# Patient Record
Sex: Male | Born: 1975
Health system: Southern US, Community
[De-identification: ages and names within clinical notes are randomized; demographics above are authoritative.]

## PROBLEM LIST (undated history)

## (undated) DIAGNOSIS — R7989 Other specified abnormal findings of blood chemistry: Secondary | ICD-10-CM

## (undated) DIAGNOSIS — E785 Hyperlipidemia, unspecified: Secondary | ICD-10-CM

## (undated) HISTORY — PX: VASECTOMY: SHX75

## (undated) HISTORY — DX: Hyperlipidemia, unspecified: E78.5

## (undated) HISTORY — DX: Other specified abnormal findings of blood chemistry: R79.89

---

## 2013-03-04 ENCOUNTER — Ambulatory Visit: Payer: Self-pay | Admitting: Physician Assistant

## 2013-03-04 ENCOUNTER — Encounter: Payer: Self-pay | Admitting: Physician Assistant

## 2013-03-04 ENCOUNTER — Ambulatory Visit (INDEPENDENT_AMBULATORY_CARE_PROVIDER_SITE_OTHER): Payer: BC Managed Care – PPO | Admitting: Physician Assistant

## 2013-03-04 VITALS — BP 153/89 | HR 58 | Temp 97.7°F | Ht 73.0 in | Wt 202.2 lb

## 2013-03-04 DIAGNOSIS — E291 Testicular hypofunction: Secondary | ICD-10-CM | POA: Insufficient documentation

## 2013-03-04 MED ORDER — TESTOSTERONE CYPIONATE 200 MG/ML IM SOLN: 200.0000 mg | INTRAMUSCULAR | Status: DC

## 2013-03-04 NOTE — Progress Notes (Signed)
  Subjective:    Patient ID: Jacob Morris, male    DOB: [birth year], 37 y.o.   MRN: [id number]  HPI Recheck of hypogonadism; would like to taper off testosterone injections with intention of discontinuing testosterone supplement; exercises regularly, runs 15 mi/wk, 300 push ups/wk, eats whole foods Current testosterone injection is 200mg /ml 0.70ml injection every 10 days   Review of Systems  All other systems reviewed and are negative.       Objective:   Physical Exam  Vitals reviewed. Constitutional: He is oriented to person, place, and time. He appears well-developed and well-nourished.  HENT:  Head: Normocephalic and atraumatic.  Right Ear: External ear normal.  Left Ear: External ear normal.  Mouth/Throat: Oropharynx is clear and moist.  Eyes: Conjunctivae and EOM are normal. Pupils are equal, round, and reactive to light.  Neck: Normal range of motion. Neck supple.  Cardiovascular: Normal rate, regular rhythm and normal heart sounds.   Pulmonary/Chest: Effort normal and breath sounds normal.  Abdominal: Soft. Bowel sounds are normal.  Musculoskeletal: Normal range of motion.  Neurological: He is alert and oriented to person, place, and time.  Skin: Skin is warm and dry.  Psychiatric: He has a normal mood and affect. His behavior is normal. Judgment and thought content normal.          Assessment & Plan:

## 2013-03-09 ENCOUNTER — Other Ambulatory Visit: Payer: Self-pay | Admitting: Physician Assistant

## 2013-03-09 NOTE — Progress Notes (Signed)
Called Jacob Morris with tesosterone result which was low at 133; discussed with him to continue the 0.66ml injection every 10 days as previously scheduled; we are backing up from our recent in office discussion of tapering off testosterone; Jacob Morris is to schedule a follow up to recheck tesosterone level in late June or July

## 2013-09-05 ENCOUNTER — Encounter: Payer: Self-pay | Admitting: Family Medicine

## 2013-09-05 ENCOUNTER — Ambulatory Visit (INDEPENDENT_AMBULATORY_CARE_PROVIDER_SITE_OTHER): Payer: BC Managed Care – PPO | Admitting: Family Medicine

## 2013-09-05 VITALS — BP 146/87 | HR 76 | Temp 98.7°F | Ht 73.5 in | Wt 209.0 lb

## 2013-09-05 DIAGNOSIS — Z23 Encounter for immunization: Secondary | ICD-10-CM

## 2013-09-05 DIAGNOSIS — R030 Elevated blood-pressure reading, without diagnosis of hypertension: Secondary | ICD-10-CM

## 2013-09-05 DIAGNOSIS — M25519 Pain in unspecified shoulder: Secondary | ICD-10-CM

## 2013-09-05 DIAGNOSIS — E291 Testicular hypofunction: Secondary | ICD-10-CM

## 2013-09-05 DIAGNOSIS — M25511 Pain in right shoulder: Secondary | ICD-10-CM

## 2013-09-05 DIAGNOSIS — N400 Enlarged prostate without lower urinary tract symptoms: Secondary | ICD-10-CM

## 2013-09-05 MED ORDER — "SYRINGE/NEEDLE (DISP) 21G X 1"" 3 ML MISC": Status: DC

## 2013-09-05 MED ORDER — TESTOSTERONE CYPIONATE 200 MG/ML IM SOLN: 100.0000 mg | Freq: Once | INTRAMUSCULAR | Status: DC

## 2013-09-05 MED ORDER — MELOXICAM 15 MG PO TABS: 15.0000 mg | ORAL_TABLET | Freq: Every day | ORAL | Status: DC

## 2013-09-05 NOTE — Progress Notes (Signed)
Subjective:    Patient ID: Jacob Morris, male    DOB: December 12, 1975, 37 y.o.   MRN: [id number]  HPI Pt here for follow up and management of chronic medical problems. Patient returns to clinic today specifically for followup and management of hypergonadism. He is currently taking injections every 10 days and getting a total of 600 mg of testosterone monthly. As of note family history is positive for hypertension in his mother. His health maintenance is up-to-date except for getting his flu shot and doing an FOBT. The patient says that he exercises regularly and that he actually does a half cc of testosterone which would be 100 mg every 10 days.   Patient Active Problem List   Diagnosis Date Noted  . Hypogonadism male 03/04/2013   Outpatient Encounter Prescriptions as of 09/05/2013  Medication Sig Dispense Refill  . testosterone cypionate (DEPOTESTOTERONE CYPIONATE) 200 MG/ML injection Inject 200 mg into the muscle every 14 (fourteen) days. Injection q 10 days (at home med)      . [DISCONTINUED] testosterone cypionate (DEPOTESTOTERONE CYPIONATE) 200 MG/ML injection Inject 1 mL (200 mg total) into the muscle every 14 (fourteen) days. Tapering off; plan is to extend injection timing to every 15 days for 2 month, then inject every 20 days for 2 months; then inject every 30 days for 2 months  10 mL  5   No facility-administered encounter medications on file as of 09/05/2013.      Review of Systems  Constitutional: Negative.   HENT: Negative.   Eyes: Negative.   Respiratory: Negative.   Cardiovascular: Negative.   Gastrointestinal: Negative.   Endocrine: Negative.   Genitourinary: Negative.   Musculoskeletal: Positive for arthralgias (left shoulder pain).  Skin: Negative.   Allergic/Immunologic: Negative.   Neurological: Negative.   Hematological: Negative.   Psychiatric/Behavioral: Negative.        Objective:   Physical Exam  Nursing note and vitals reviewed. Constitutional: He is  oriented to person, place, and time. He appears well-developed and well-nourished. No distress.  HENT:  Head: Normocephalic and atraumatic.  Right Ear: External ear normal.  Left Ear: External ear normal.  Nose: Nose normal.  Mouth/Throat: Oropharynx is clear and moist. No oropharyngeal exudate.  Eyes: Conjunctivae and EOM are normal. Pupils are equal, round, and reactive to light. Right eye exhibits no discharge. Left eye exhibits no discharge. No scleral icterus.  Neck: Normal range of motion. Neck supple. No tracheal deviation present. No thyromegaly present.  Cardiovascular: Normal rate, regular rhythm, normal heart sounds and intact distal pulses.  Exam reveals no gallop and no friction rub.   No murmur heard. Pulmonary/Chest: Effort normal and breath sounds normal. No respiratory distress. He has no wheezes. He has no rales. He exhibits no tenderness.  Abdominal: Soft. Bowel sounds are normal. He exhibits no mass. There is no tenderness. There is no rebound and no guarding.  Genitourinary: Rectum normal, prostate normal and penis normal.  Prostate gland is smooth but slightly enlarged. There are no rectal masses. There is no inguinal hernia palpated on either side. The testicles are normal without any masses.  Musculoskeletal: Normal range of motion. He exhibits no edema and no tenderness.  There was some slight tenderness in the left anterior shoulder up to the left acromioclavicular joint.  Lymphadenopathy:    He has no cervical adenopathy.  Neurological: He is alert and oriented to person, place, and time. He has normal reflexes. No cranial nerve deficit.  Skin: Skin is warm and dry. No rash  noted. No erythema. No pallor.  Psychiatric: He has a normal mood and affect. His behavior is normal. Judgment and thought content normal.   BP 146/87  Pulse 76  Temp(Src) 98.7 F (37.1 C) (Oral)  Ht 6' 1.5" (1.867 m)  Wt 209 lb (94.802 kg)  BMI 27.2 kg/m2        Assessment & Plan:    1. Hypogonadism male   2.  Right shoulder pain secondary to biceps tendinitis   3. Elevated blood pressure   4. BPH (benign prostatic hyperplasia)    Orders Placed This Encounter  Procedures  . NMR, lipoprofile    Standing Status: Future     Number of Occurrences:      Standing Expiration Date: 09/05/2014  . BMP8+EGFR    Standing Status: Future     Number of Occurrences:      Standing Expiration Date: 09/05/2014  . Hepatic function panel    Standing Status: Future     Number of Occurrences:      Standing Expiration Date: 09/05/2014  . PSA, total and free    Standing Status: Future     Number of Occurrences:      Standing Expiration Date: 09/05/2014  . Testosterone,Free and Total    Standing Status: Future     Number of Occurrences:      Standing Expiration Date: 09/05/2014  . Vit D  25 hydroxy (rtn osteoporosis monitoring)    Standing Status: Future     Number of Occurrences:      Standing Expiration Date: 09/05/2014  . POCT CBC    Standing Status: Future     Number of Occurrences:      Standing Expiration Date: 10/05/2013   Meds ordered this encounter  Medications  . DISCONTD: testosterone cypionate (DEPOTESTOTERONE CYPIONATE) 200 MG/ML injection    Sig: Inject 100 mg into the muscle once. Injection q 10 days (at home med)  . testosterone cypionate (DEPOTESTOTERONE CYPIONATE) 200 MG/ML injection    Sig: Inject 0.5 mLs (100 mg total) into the muscle once. Injection q 10 days (at home med)    Dispense:  10 mL    Refill:  5  . meloxicam (MOBIC) 15 MG tablet    Sig: Take 1 tablet (15 mg total) by mouth daily.    Dispense:  30 tablet    Refill:  0   Patient Instructions  Continue current medications. Continue good therapeutic lifestyle changes.  Fall precautions discussed with patient. Schedule your flu vaccine the first of October. Follow up as planned and earlier as needed. Take Mobic one daily after eating, discontinue if any problems with acid reflux or abdominal  pain  Use warm wet compresses to left shoulder 20 minutes 3 or 4 times daily Because of shoulder pain reduced pushups for the next couple of weeks The pain not better return to clinic for possible injection  Check blood pressures as directed return readings in 4-6 weeks for review Watch sodium intake   Nyra Capes MD

## 2013-09-05 NOTE — Patient Instructions (Addendum)
Continue current medications. Continue good therapeutic lifestyle changes.  Fall precautions discussed with patient. Schedule your flu vaccine the first of October. Follow up as planned and earlier as needed. Take Mobic one daily after eating, discontinue if any problems with acid reflux or abdominal pain  Use warm wet compresses to left shoulder 20 minutes 3 or 4 times daily Because of shoulder pain reduced pushups for the next couple of weeks The pain not better return to clinic for possible injection  Check blood pressures as directed return readings in 4-6 weeks for review Watch sodium intake

## 2013-09-05 NOTE — Addendum Note (Signed)
Addended by: Gwenith Daily on: 09/05/2013 09:00 AM   Modules accepted: Orders

## 2013-09-05 NOTE — Addendum Note (Signed)
Addended by: Magdalene River on: 09/05/2013 09:08 AM   Modules accepted: Orders

## 2013-09-06 ENCOUNTER — Other Ambulatory Visit: Payer: BC Managed Care – PPO

## 2013-09-06 ENCOUNTER — Other Ambulatory Visit (INDEPENDENT_AMBULATORY_CARE_PROVIDER_SITE_OTHER): Payer: BC Managed Care – PPO

## 2013-09-06 DIAGNOSIS — E291 Testicular hypofunction: Secondary | ICD-10-CM

## 2013-09-06 DIAGNOSIS — M25519 Pain in unspecified shoulder: Secondary | ICD-10-CM

## 2013-09-06 LAB — POCT CBC
Hemoglobin: 16.9 g/dL (ref 14.1–18.1)
MCHC: 32.5 g/dL (ref 31.8–35.4)
MCV: 86.3 fL (ref 80–97)
MPV: 6.9 fL (ref 0–99.8)
POC Granulocyte: 4 (ref 2–6.9)
POC LYMPH PERCENT: 31.3 %L (ref 10–50)
Platelet Count, POC: 256 10*3/uL (ref 142–424)
RDW, POC: 13.6 %
WBC: 6.2 10*3/uL (ref 4.6–10.2)

## 2013-09-08 ENCOUNTER — Telehealth: Payer: Self-pay | Admitting: *Deleted

## 2013-09-08 ENCOUNTER — Encounter: Payer: Self-pay | Admitting: *Deleted

## 2013-09-08 LAB — HEPATIC FUNCTION PANEL
Albumin: 4.5 g/dL (ref 3.5–5.5)
Total Bilirubin: 0.6 mg/dL (ref 0.0–1.2)

## 2013-09-08 LAB — PSA, TOTAL AND FREE
PSA, Free Pct: 64 %
PSA, Free: 0.32 ng/mL

## 2013-09-08 LAB — BMP8+EGFR
BUN: 15 mg/dL (ref 6–20)
CO2: 29 mmol/L (ref 18–29)
Calcium: 9.7 mg/dL (ref 8.7–10.2)
Creatinine, Ser: 1.17 mg/dL (ref 0.76–1.27)
Glucose: 97 mg/dL (ref 65–99)

## 2013-09-08 LAB — NMR, LIPOPROFILE
HDL Particle Number: 34.5 umol/L (ref >=30.5)
LDL Particle Number: 1613 nmol/L — ABNORMAL HIGH (ref <1000)
LDL Size: 21.3 nm (ref >20.5)
LP-IR Score: 59 — ABNORMAL HIGH (ref <=45)

## 2013-09-08 LAB — TESTOSTERONE,FREE AND TOTAL
Testosterone, Free: 11.7 pg/mL (ref 8.7–25.1)
Testosterone: 591 ng/dL (ref 348–1197)

## 2013-09-08 LAB — VITAMIN D 25 HYDROXY (VIT D DEFICIENCY, FRACTURES): Vit D, 25-Hydroxy: 21.4 ng/mL — ABNORMAL LOW (ref 30.0–100.0)

## 2013-09-08 MED ORDER — VITAMIN D (ERGOCALCIFEROL) 1.25 MG (50000 UNIT) PO CAPS: 50000.0000 [IU] | ORAL_CAPSULE | ORAL | Status: DC

## 2013-09-08 NOTE — Telephone Encounter (Signed)
Message copied by Bearl Mulberry on Thu Sep 08, 2013  2:27 PM ------      Message from: Ernestina Penna      Created: Thu Sep 08, 2013  7:25 AM       Advanced lipid testing, a total LDL [place] number is 1613. This number should be less than 1000. The LDL C. is 124. This number should be less than 100.      Triglycerides are good. The HDL particle number is good. The small LDL Particle number is elevated.--------- please schedule patient a visit with the clinical pharmacist to discuss aggressive therapeutic lifestyle changes which include diet and exercise, and possibly low-dose statin therapy+++++++++++      Blood sugar renal and electrolytes are all within normal limits      Liver function tests are within normal limit      The PSA is low and within normal limit      The total testosterone is within normal limits at 591. The free direct testosterone is 11.7 and within normal limit      The vitamin D level is very low at 21.4------- call in a prescription for vitamin D 50,000 units once weekly for 12 weeks with one refill. Repeat vitamin D level in 12 weeks ------

## 2013-09-08 NOTE — Telephone Encounter (Signed)
Pt's wife notified of results Verbalizes understanding RX for Vit D sent into [facility]

## 2014-01-16 ENCOUNTER — Other Ambulatory Visit: Payer: Self-pay | Admitting: *Deleted

## 2014-01-16 NOTE — Telephone Encounter (Signed)
Error in note

## 2014-03-06 ENCOUNTER — Ambulatory Visit (INDEPENDENT_AMBULATORY_CARE_PROVIDER_SITE_OTHER): Payer: BC Managed Care – PPO | Admitting: Family Medicine

## 2014-03-06 ENCOUNTER — Encounter: Payer: Self-pay | Admitting: Family Medicine

## 2014-03-06 VITALS — BP 143/91 | HR 58 | Temp 98.3°F | Ht 73.5 in | Wt 201.0 lb

## 2014-03-06 DIAGNOSIS — E559 Vitamin D deficiency, unspecified: Secondary | ICD-10-CM

## 2014-03-06 DIAGNOSIS — Z0000 Encounter for general adult medical examination without abnormal findings: Secondary | ICD-10-CM

## 2014-03-06 DIAGNOSIS — E291 Testicular hypofunction: Secondary | ICD-10-CM

## 2014-03-06 DIAGNOSIS — L739 Follicular disorder, unspecified: Secondary | ICD-10-CM

## 2014-03-06 DIAGNOSIS — L738 Other specified follicular disorders: Secondary | ICD-10-CM

## 2014-03-06 DIAGNOSIS — E349 Endocrine disorder, unspecified: Secondary | ICD-10-CM | POA: Insufficient documentation

## 2014-03-06 DIAGNOSIS — L678 Other hair color and hair shaft abnormalities: Secondary | ICD-10-CM

## 2014-03-06 MED ORDER — TESTOSTERONE CYPIONATE 200 MG/ML IM SOLN: 100.0000 mg | Freq: Once | INTRAMUSCULAR | Status: DC

## 2014-03-06 NOTE — Patient Instructions (Addendum)
Continue current medications. Continue good therapeutic lifestyle changes which include good diet and exercise. Fall precautions discussed with patient. If an FOBT was given today- please return it to our front desk.  Continue testosterone injections as doing Return to clinic in 6 months Return to clinic for lab work We will call you with the results of the lab work and send [place] those results are available The healthy drink plenty of water and stay active  Use scent-free fabric softener Use dermatology approved detergent Use antibacterial soap for skin

## 2014-03-06 NOTE — Progress Notes (Signed)
Subjective:    Patient ID: Jacob Morris, male    DOB: [birth year], 38 y.o.   MRN: [id number]  HPI Pt here for follow up and management of chronic medical problems. Patient gives his own testosterone injections every 10 days and he is currently taking about 4/10 of a milliliter of a solution is 200 per milliliter he indicates he is feeling well he is playing of energy. He brings in outside blood pressures which overall are very good. His readings in the office were higher but these were comparable to his home cuff readings. See outside blood pressure readings, these will be scanned into the record.       Patient Active Problem List   Diagnosis Date Noted  . Testosterone deficiency 03/06/2014  . Hypogonadism male 03/04/2013   Outpatient Encounter Prescriptions as of 03/06/2014  Medication Sig  . SYRINGE-NEEDLE, DISP, 3 ML 21G X 1" 3 ML MISC Use to inject testosterone every 10 days  . testosterone cypionate (DEPOTESTOTERONE CYPIONATE) 200 MG/ML injection Inject 0.5 mLs (100 mg total) into the muscle once. Injection q 10 days (at home med)  . [DISCONTINUED] meloxicam (MOBIC) 15 MG tablet Take 1 tablet (15 mg total) by mouth daily.  . [DISCONTINUED] Vitamin D, Ergocalciferol, (DRISDOL) 50000 UNITS CAPS capsule Take 1 capsule (50,000 Units total) by mouth every 7 (seven) days.    Review of Systems  Constitutional: Negative.   HENT: Negative.   Eyes: Negative.   Respiratory: Negative.   Cardiovascular: Negative.   Gastrointestinal: Negative.   Endocrine: Negative.   Genitourinary: Negative.   Musculoskeletal: Negative.   Skin: Negative.   Allergic/Immunologic: Negative.   Neurological: Negative.   Hematological: Negative.   Psychiatric/Behavioral: Negative.        Objective:   Physical Exam  Nursing note and vitals reviewed. Constitutional: He is oriented to person, place, and time. He appears well-developed and well-nourished.  HENT:  Head: Normocephalic and atraumatic.    Right Ear: External ear normal.  Left Ear: External ear normal.  Nose: Nose normal.  Mouth/Throat: Oropharynx is clear and moist. No oropharyngeal exudate.  Eyes: Conjunctivae and EOM are normal. Pupils are equal, round, and reactive to light. Right eye exhibits no discharge. Left eye exhibits no discharge. No scleral icterus.  Neck: Normal range of motion. Neck supple. No tracheal deviation present. No thyromegaly present.  Cardiovascular: Normal rate, regular rhythm, normal heart sounds and intact distal pulses.  Exam reveals no gallop and no friction rub.   No murmur heard. At 60 per minute  Pulmonary/Chest: Effort normal and breath sounds normal. No respiratory distress. He has no wheezes. He has no rales. He exhibits no tenderness.  No axillary node  Abdominal: Soft. Bowel sounds are normal. He exhibits no mass. There is no tenderness. There is no rebound and no guarding.  Genitourinary: Rectum normal, prostate normal and penis normal.  Testicular masses no hernia. No nodes  Musculoskeletal: Normal range of motion. He exhibits no edema and no tenderness.  Lymphadenopathy:    He has no cervical adenopathy.  Neurological: He is alert and oriented to person, place, and time. No cranial nerve deficit.  Skin: Skin is warm and dry. Rash noted. No erythema. No pallor.  Mild folliculitis of buttocks  Psychiatric: He has a normal mood and affect. His behavior is normal. Judgment and thought content normal.   BP 143/91  Pulse 58  Temp(Src) 98.3 F (36.8 C) (Oral)  Ht 6' 1.5" (1.867 m)  Wt 201 lb (91.173 kg)  BMI 26.16 kg/m2  Please see home blood pressures as all of these are well within the normal range.      Assessment & Plan:  1. Testosterone deficiency - POCT CBC; Future - PSA, total and free; Future - POCT UA - Microscopic Only; Future - POCT urinalysis dipstick; Future - Testosterone,Free and Total; Future  2. Hypogonadism male - testosterone cypionate (DEPOTESTOTERONE  CYPIONATE) 200 MG/ML injection; Inject 0.5 mLs (100 mg total) into the muscle once. Injection q 10 days (at home med)  Dispense: 10 mL; Refill: 5  3. Healthcare maintenance - POCT CBC; Future - Hepatic function panel; Future - BMP8+EGFR; Future - Vit D  25 hydroxy (rtn osteoporosis monitoring); Future  4. Vitamin D deficiency - Vit D  25 hydroxy (rtn osteoporosis monitoring); Future  5. Folliculitis  Patient Instructions  Continue current medications. Continue good therapeutic lifestyle changes which include good diet and exercise. Fall precautions discussed with patient. If an FOBT was given today- please return it to our front desk.  Continue testosterone injections as doing Return to clinic in 6 months Return to clinic for lab work We will call you with the results of the lab work and send [place] those results are available The healthy drink plenty of water and stay active  Use scent-free fabric softener Use dermatology approved detergent Use antibacterial soap for skin   Arrie Senate MD

## 2014-03-14 ENCOUNTER — Other Ambulatory Visit (INDEPENDENT_AMBULATORY_CARE_PROVIDER_SITE_OTHER): Payer: BC Managed Care – PPO

## 2014-03-14 DIAGNOSIS — E559 Vitamin D deficiency, unspecified: Secondary | ICD-10-CM

## 2014-03-14 DIAGNOSIS — E349 Endocrine disorder, unspecified: Secondary | ICD-10-CM

## 2014-03-14 DIAGNOSIS — Z0000 Encounter for general adult medical examination without abnormal findings: Secondary | ICD-10-CM

## 2014-03-14 DIAGNOSIS — E291 Testicular hypofunction: Secondary | ICD-10-CM

## 2014-03-14 LAB — POCT UA - MICROSCOPIC ONLY
BACTERIA, U MICROSCOPIC: NEGATIVE
CRYSTALS, UR, HPF, POC: NEGATIVE
Casts, Ur, LPF, POC: NEGATIVE
Epithelial cells, urine per micros: NEGATIVE
MUCUS UA: NEGATIVE
RBC, URINE, MICROSCOPIC: NEGATIVE
WBC, Ur, HPF, POC: NEGATIVE
Yeast, UA: NEGATIVE

## 2014-03-14 LAB — POCT CBC
GRANULOCYTE PERCENT: 62.3 % (ref 37–80)
HCT, POC: 49.7 % (ref 43.5–53.7)
HEMOGLOBIN: 16 g/dL (ref 14.1–18.1)
Lymph, poc: 1.9 (ref 0.6–3.4)
MCH, POC: 28.5 pg (ref 27–31.2)
MCHC: 32.3 g/dL (ref 31.8–35.4)
MCV: 88.5 fL (ref 80–97)
MPV: 7.8 fL (ref 0–99.8)
POC GRANULOCYTE: 3.7 (ref 2–6.9)
POC LYMPH PERCENT: 32.7 %L (ref 10–50)
Platelet Count, POC: 209 10*3/uL (ref 142–424)
RBC: 5.6 M/uL (ref 4.69–6.13)
RDW, POC: 13.6 %
WBC: 5.9 10*3/uL (ref 4.6–10.2)

## 2014-03-14 LAB — POCT URINALYSIS DIPSTICK
Bilirubin, UA: NEGATIVE
Blood, UA: NEGATIVE
Glucose, UA: NEGATIVE
KETONES UA: NEGATIVE
Leukocytes, UA: NEGATIVE
Nitrite, UA: NEGATIVE
PROTEIN UA: NEGATIVE
Urobilinogen, UA: NEGATIVE
pH, UA: 7.5

## 2014-03-14 NOTE — Progress Notes (Signed)
Pt came in for labs only 

## 2014-03-15 LAB — TESTOSTERONE,FREE AND TOTAL
TESTOSTERONE FREE: 20.4 pg/mL (ref 8.7–25.1)
Testosterone: 489 ng/dL (ref 348–1197)

## 2014-03-15 LAB — BMP8+EGFR
BUN / CREAT RATIO: 8 (ref 8–19)
BUN: 8 mg/dL (ref 6–20)
CHLORIDE: 98 mmol/L (ref 97–108)
CO2: 24 mmol/L (ref 18–29)
Calcium: 9.9 mg/dL (ref 8.7–10.2)
Creatinine, Ser: 1.05 mg/dL (ref 0.76–1.27)
GFR calc non Af Amer: 90 mL/min/{1.73_m2} (ref >59)
GFR, EST AFRICAN AMERICAN: 104 mL/min/{1.73_m2} (ref >59)
Glucose: 93 mg/dL (ref 65–99)
POTASSIUM: 4.5 mmol/L (ref 3.5–5.2)
SODIUM: 139 mmol/L (ref 134–144)

## 2014-03-15 LAB — HEPATIC FUNCTION PANEL
ALBUMIN: 4.8 g/dL (ref 3.5–5.5)
ALK PHOS: 49 IU/L (ref 39–117)
ALT: 18 IU/L (ref 0–44)
AST: 23 IU/L (ref 0–40)
BILIRUBIN DIRECT: 0.16 mg/dL (ref 0.00–0.40)
BILIRUBIN TOTAL: 0.5 mg/dL (ref 0.0–1.2)
TOTAL PROTEIN: 7.2 g/dL (ref 6.0–8.5)

## 2014-03-15 LAB — VITAMIN D 25 HYDROXY (VIT D DEFICIENCY, FRACTURES): VIT D 25 HYDROXY: 23.6 ng/mL — AB (ref 30.0–100.0)

## 2014-03-15 LAB — PSA, TOTAL AND FREE
PSA, Free Pct: 80 %
PSA, Free: 0.32 ng/mL
PSA: 0.4 ng/mL (ref 0.0–4.0)

## 2014-03-21 ENCOUNTER — Other Ambulatory Visit: Payer: Self-pay | Admitting: *Deleted

## 2014-03-21 DIAGNOSIS — E559 Vitamin D deficiency, unspecified: Secondary | ICD-10-CM

## 2014-03-21 MED ORDER — VITAMIN D (ERGOCALCIFEROL) 1.25 MG (50000 UNIT) PO CAPS: 50000.0000 [IU] | ORAL_CAPSULE | ORAL | Status: DC

## 2014-09-06 ENCOUNTER — Ambulatory Visit (INDEPENDENT_AMBULATORY_CARE_PROVIDER_SITE_OTHER): Payer: BC Managed Care – PPO | Admitting: Family Medicine

## 2014-09-06 ENCOUNTER — Ambulatory Visit (INDEPENDENT_AMBULATORY_CARE_PROVIDER_SITE_OTHER): Payer: BC Managed Care – PPO

## 2014-09-06 ENCOUNTER — Encounter: Payer: Self-pay | Admitting: Family Medicine

## 2014-09-06 VITALS — BP 137/84 | HR 59 | Temp 98.2°F | Ht 73.5 in | Wt 201.0 lb

## 2014-09-06 DIAGNOSIS — M19012 Primary osteoarthritis, left shoulder: Secondary | ICD-10-CM

## 2014-09-06 DIAGNOSIS — Z0000 Encounter for general adult medical examination without abnormal findings: Secondary | ICD-10-CM

## 2014-09-06 DIAGNOSIS — E559 Vitamin D deficiency, unspecified: Secondary | ICD-10-CM

## 2014-09-06 DIAGNOSIS — E349 Endocrine disorder, unspecified: Secondary | ICD-10-CM

## 2014-09-06 DIAGNOSIS — E291 Testicular hypofunction: Secondary | ICD-10-CM

## 2014-09-06 LAB — POCT URINALYSIS DIPSTICK
Bilirubin, UA: NEGATIVE
Blood, UA: NEGATIVE
Glucose, UA: NEGATIVE
KETONES UA: NEGATIVE
Leukocytes, UA: NEGATIVE
Nitrite, UA: NEGATIVE
PROTEIN UA: NEGATIVE
SPEC GRAV UA: 1.01
Urobilinogen, UA: NEGATIVE
pH, UA: 7.5

## 2014-09-06 LAB — POCT UA - MICROSCOPIC ONLY
BACTERIA, U MICROSCOPIC: NEGATIVE
CASTS, UR, LPF, POC: NEGATIVE
CRYSTALS, UR, HPF, POC: NEGATIVE
MUCUS UA: NEGATIVE
RBC, urine, microscopic: NEGATIVE
WBC, Ur, HPF, POC: NEGATIVE
Yeast, UA: NEGATIVE

## 2014-09-06 MED ORDER — MELOXICAM 7.5 MG PO TABS: 7.5000 mg | ORAL_TABLET | Freq: Every day | ORAL | Status: DC

## 2014-09-06 NOTE — Patient Instructions (Addendum)
Continue current medications. Continue good therapeutic lifestyle changes which include good diet and exercise. Fall precautions discussed with patient. If an FOBT was given today- please return it to our front desk.  Flu Shots will be available at our office starting mid- September. Please call and schedule a FLU CLINIC APPOINTMENT.   Continue testosterone injections as doing We will call you with the results of the lab work his sinuses results are available Rotate injection sites as discussed by the clinical pharmacist Continue monitoring blood pressures at home and bring readings for review to each visit

## 2014-09-06 NOTE — Progress Notes (Signed)
Subjective:    Patient ID: Jacob Morris, male    DOB: 11-Apr-1976, 38 y.o.   MRN: [id number]  HPI Pt here for follow up and management of chronic medical problems. He is also here for his annual exam. His biggest problem is his ongoing testosterone deficiency and his testosterone injections. He has not had a chest x-ray or a EKG. We will do these today. The only significant issue that has developed is that his father has a cerebral aneurysm. This aneurysm has been repaired and this was actually 7 years ago. His sister is stable and his mom has a history of asthma and hypertension. The patient indicates that his energy level is good and that his sexual function is normal          Patient Active Problem List   Diagnosis Date Noted  . Testosterone deficiency 03/06/2014  . Hypogonadism male 03/04/2013   Outpatient Encounter Prescriptions as of 09/06/2014  Medication Sig  . SYRINGE-NEEDLE, DISP, 3 ML 21G X 1" 3 ML MISC Use to inject testosterone every 10 days  . testosterone cypionate (DEPOTESTOTERONE CYPIONATE) 200 MG/ML injection Inject 0.5 mLs (100 mg total) into the muscle once. Injection q 10 days (at home med)  . [DISCONTINUED] Vitamin D, Ergocalciferol, (DRISDOL) 50000 UNITS CAPS capsule Take 1 capsule (50,000 Units total) by mouth every 7 (seven) days.    Review of Systems  Constitutional: Negative.   HENT: Negative.   Eyes: Negative.   Respiratory: Negative.   Cardiovascular: Negative.   Gastrointestinal: Negative.   Endocrine: Negative.   Genitourinary: Negative.   Musculoskeletal: Positive for arthralgias (left shoulder pain - went to sports med MD).  Skin: Negative.   Allergic/Immunologic: Negative.   Neurological: Negative.   Hematological: Negative.   Psychiatric/Behavioral: Negative.        Objective:   Physical Exam  Nursing note and vitals reviewed. Constitutional: He is oriented to person, place, and time. He appears well-developed and well-nourished.    HENT:  Head: Normocephalic and atraumatic.  Right Ear: External ear normal.  Left Ear: External ear normal.  Mouth/Throat: Oropharynx is clear and moist. No oropharyngeal exudate.  There is nasal turbinate congestion bilaterally  Eyes: Conjunctivae and EOM are normal. Pupils are equal, round, and reactive to light. Right eye exhibits no discharge. Left eye exhibits no discharge. No scleral icterus.  Neck: Normal range of motion. Neck supple. No thyromegaly present.  Cardiovascular: Normal rate, regular rhythm, normal heart sounds and intact distal pulses.  Exam reveals no gallop and no friction rub.   No murmur heard. At 60 per minute  Pulmonary/Chest: Effort normal and breath sounds normal. No respiratory distress. He has no wheezes. He has no rales. He exhibits no tenderness.  Abdominal: Soft. Bowel sounds are normal. He exhibits no mass. There is no tenderness. There is no rebound and no guarding.  Genitourinary: Rectum normal and penis normal.  Minimal prostate enlargement. There were no lumps or rectal masses and there was no inguinal hernia palpated or lymph nodes  Musculoskeletal: Normal range of motion. He exhibits no edema and no tenderness.  Lymphadenopathy:    He has no cervical adenopathy.  Neurological: He is alert and oriented to person, place, and time. He has normal reflexes. No cranial nerve deficit.  Skin: Skin is warm and dry. No rash noted. No erythema. No pallor.  Psychiatric: He has a normal mood and affect. His behavior is normal. Judgment and thought content normal.   BP 137/84  Pulse 59  Temp(Src) 98.2 F (36.8 C) (Oral)  Ht 6' 1.5" (1.867 m)  Wt 201 lb (91.173 kg)  BMI 26.16 kg/m2   EKG: normal EKG, normal sinus rhythm--- the patient was made aware of this result  [facility] reading (PRIMARY) by  Dr. Brunilda Payor x-ray--no active disease                                         Assessment & Plan:  1. Hypogonadism male - PSA, total and free; Future -  Testosterone,Free and Total; Future  2. Testosterone deficiency - POCT CBC; Future - POCT UA - Microscopic Only - POCT urinalysis dipstick - PSA, total and free; Future - Testosterone,Free and Total; Future  3. Vitamin D deficiency - Vit D  25 hydroxy (rtn osteoporosis monitoring); Future  4. Healthcare maintenance - POCT CBC; Future - BMP8+EGFR; Future - Hepatic function panel; Future - POCT UA - Microscopic Only - POCT urinalysis dipstick - NMR, lipoprofile; Future - PSA, total and free; Future - Testosterone,Free and Total; Future - Vit D  25 hydroxy (rtn osteoporosis monitoring); Future  5. Annual physical exam - DG Chest 2 View; Future - EKG 12-Lead  6. Arthritis of left acromioclavicular joint - meloxicam (MOBIC) 7.5 MG tablet; Take 1 tablet (7.5 mg total) by mouth daily.  Dispense: 30 tablet; Refill: 0  Patient Instructions  Continue current medications. Continue good therapeutic lifestyle changes which include good diet and exercise. Fall precautions discussed with patient. If an FOBT was given today- please return it to our front desk.  Flu Shots will be available at our office starting mid- September. Please call and schedule a FLU CLINIC APPOINTMENT.   Continue testosterone injections as doing We will call you with the results of the lab work his sinuses results are available Rotate injection sites as discussed by the clinical pharmacist Continue monitoring blood pressures at home and bring readings for review to each visit   Arrie Senate MD

## 2014-09-07 ENCOUNTER — Telehealth: Payer: Self-pay

## 2014-09-07 NOTE — Telephone Encounter (Signed)
Pt aware of CXR results.

## 2014-09-07 NOTE — Telephone Encounter (Signed)
[facility] to X-ray 

## 2014-09-07 NOTE — Telephone Encounter (Signed)
Message copied by Koren Bound on Thu Sep 07, 2014  1:47 PM ------      Message from: Chipper Herb      Created: Wed Sep 06, 2014  5:32 PM       As per radiology report ------

## 2014-09-07 NOTE — Telephone Encounter (Signed)
Returning [place] call.  Call him at [phone]

## 2014-09-08 ENCOUNTER — Telehealth: Payer: Self-pay | Admitting: *Deleted

## 2014-09-08 NOTE — Telephone Encounter (Signed)
Message copied by Thana Ates on Fri Sep 08, 2014 10:05 AM ------      Message from: Chipper Herb      Created: Thu Sep 07, 2014  7:55 PM       As per note ------

## 2014-09-09 ENCOUNTER — Encounter: Payer: Self-pay | Admitting: *Deleted

## 2014-09-11 ENCOUNTER — Other Ambulatory Visit (INDEPENDENT_AMBULATORY_CARE_PROVIDER_SITE_OTHER): Payer: BC Managed Care – PPO

## 2014-09-11 DIAGNOSIS — E291 Testicular hypofunction: Secondary | ICD-10-CM

## 2014-09-11 DIAGNOSIS — E559 Vitamin D deficiency, unspecified: Secondary | ICD-10-CM

## 2014-09-11 DIAGNOSIS — Z0000 Encounter for general adult medical examination without abnormal findings: Secondary | ICD-10-CM

## 2014-09-11 DIAGNOSIS — Z1212 Encounter for screening for malignant neoplasm of rectum: Secondary | ICD-10-CM

## 2014-09-11 DIAGNOSIS — E349 Endocrine disorder, unspecified: Secondary | ICD-10-CM

## 2014-09-11 LAB — POCT CBC
GRANULOCYTE PERCENT: 59.6 % (ref 37–80)
HCT, POC: 47.1 % (ref 43.5–53.7)
Hemoglobin: 15.3 g/dL (ref 14.1–18.1)
LYMPH, POC: 2.4 (ref 0.6–3.4)
MCH, POC: 28.1 pg (ref 27–31.2)
MCHC: 32.6 g/dL (ref 31.8–35.4)
MCV: 86 fL (ref 80–97)
MPV: 7.2 fL (ref 0–99.8)
PLATELET COUNT, POC: 226 10*3/uL (ref 142–424)
POC Granulocyte: 3.9 (ref 2–6.9)
POC LYMPH PERCENT: 36.9 %L (ref 10–50)
RBC: 5.5 M/uL (ref 4.69–6.13)
RDW, POC: 13.2 %
WBC: 6.5 10*3/uL (ref 4.6–10.2)

## 2014-09-12 LAB — BMP8+EGFR
BUN/Creatinine Ratio: 10 (ref 8–19)
BUN: 11 mg/dL (ref 6–20)
CO2: 25 mmol/L (ref 18–29)
Calcium: 9.4 mg/dL (ref 8.7–10.2)
Chloride: 99 mmol/L (ref 97–108)
Creatinine, Ser: 1.12 mg/dL (ref 0.76–1.27)
GFR calc Af Amer: 95 mL/min/{1.73_m2} (ref >59)
GFR calc non Af Amer: 82 mL/min/{1.73_m2} (ref >59)
GLUCOSE: 105 mg/dL — AB (ref 65–99)
Potassium: 4.2 mmol/L (ref 3.5–5.2)
Sodium: 140 mmol/L (ref 134–144)

## 2014-09-12 LAB — NMR, LIPOPROFILE
CHOLESTEROL: 168 mg/dL (ref 100–199)
HDL Cholesterol by NMR: 48 mg/dL (ref >39)
HDL PARTICLE NUMBER: 40.5 umol/L (ref >=30.5)
LDL Particle Number: 1212 nmol/L — ABNORMAL HIGH (ref <1000)
LDL Size: 21.2 nm (ref >20.5)
LDLC SERPL CALC-MCNC: 101 mg/dL — AB (ref 0–99)
LP-IR Score: 70 — ABNORMAL HIGH (ref <=45)
Small LDL Particle Number: 479 nmol/L (ref <=527)
Triglycerides by NMR: 96 mg/dL (ref 0–149)

## 2014-09-12 LAB — FECAL OCCULT BLOOD, IMMUNOCHEMICAL: FECAL OCCULT BLD: NEGATIVE

## 2014-09-12 LAB — HEPATIC FUNCTION PANEL
ALBUMIN: 4.4 g/dL (ref 3.5–5.5)
ALT: 18 IU/L (ref 0–44)
AST: 22 IU/L (ref 0–40)
Alkaline Phosphatase: 50 IU/L (ref 39–117)
Bilirubin, Direct: 0.11 mg/dL (ref 0.00–0.40)
TOTAL PROTEIN: 6.8 g/dL (ref 6.0–8.5)
Total Bilirubin: 0.3 mg/dL (ref 0.0–1.2)

## 2014-09-12 LAB — TESTOSTERONE,FREE AND TOTAL
TESTOSTERONE: 496 ng/dL (ref 348–1197)
Testosterone, Free: 16 pg/mL (ref 8.7–25.1)

## 2014-09-12 LAB — VITAMIN D 25 HYDROXY (VIT D DEFICIENCY, FRACTURES): Vit D, 25-Hydroxy: 25.6 ng/mL — ABNORMAL LOW (ref 30.0–100.0)

## 2014-09-12 LAB — PSA, TOTAL AND FREE
PSA, Free Pct: 72 %
PSA, Free: 0.36 ng/mL
PSA: 0.5 ng/mL (ref 0.0–4.0)

## 2014-09-12 NOTE — Telephone Encounter (Signed)
Message copied by Marin Olp on Tue Sep 12, 2014  6:51 PM ------      Message from: Chipper Herb      Created: Tue Sep 12, 2014  6:27 PM       Please call the patient and let him know the results of his blood work with recommendations ------

## 2014-09-12 NOTE — Telephone Encounter (Signed)
Message copied by Waverly Ferrari on Tue Sep 12, 2014  2:50 PM ------      Message from: Chipper Herb      Created: Tue Sep 12, 2014  2:22 PM       The vitamin D level is low. Start vitamin D 50,000 units #12 one weekly one refill----recheck vitamin D in 3 months      The blood sugar is slightly elevated at 105. The creatinine, the mesenteric kidney function test is within normal limits. The electrolytes including potassium are within normal limits       All liver function tests are within normal limit      Cholesterol numbers with advanced lipid testing had an LDL particle number that is improved from one year ago at 1212. The goal for this numbers less than 1000. The LDL C. remains very slightly elevated at 101 and this is also improved from one year ago. The goal for this numbers less than 100. The triglycerides are good. The good cholesterol or the HDL particle number is also good.----Continue aggressive therapeutic lifestyle changes which include diet and exercise and especially drinking or other and watching the carbohydrates in the diet.      The PSA remains low and within normal limits. The rate of rise over the past year has been 0.      Testosterone levels are good. The total testosterone is 496 and this is consistent with past readings. The free direct testosterone level is also within normal limits--- continue current treatment ------

## 2014-10-26 ENCOUNTER — Other Ambulatory Visit: Payer: Self-pay | Admitting: Family Medicine

## 2014-10-27 ENCOUNTER — Other Ambulatory Visit: Payer: Self-pay | Admitting: Family Medicine

## 2014-10-27 NOTE — Telephone Encounter (Signed)
rx will print, he will pickup next week

## 2014-10-31 ENCOUNTER — Other Ambulatory Visit: Payer: Self-pay | Admitting: Family Medicine

## 2014-11-07 ENCOUNTER — Other Ambulatory Visit: Payer: Self-pay | Admitting: *Deleted

## 2014-11-07 ENCOUNTER — Other Ambulatory Visit: Payer: Self-pay | Admitting: Family Medicine

## 2014-11-07 ENCOUNTER — Telehealth: Payer: Self-pay | Admitting: Family Medicine

## 2014-11-07 MED ORDER — TESTOSTERONE CYPIONATE 200 MG/ML IM SOLN
INTRAMUSCULAR | Status: DC
Start: 2014-11-07 — End: 2014-11-07

## 2014-11-07 MED ORDER — TESTOSTERONE CYPIONATE 200 MG/ML IM SOLN: INTRAMUSCULAR | Status: DC

## 2014-11-07 NOTE — Telephone Encounter (Signed)
Wife aware rx is ready for pick up

## 2015-02-26 ENCOUNTER — Telehealth: Payer: Self-pay | Admitting: Family Medicine

## 2015-02-27 ENCOUNTER — Other Ambulatory Visit (INDEPENDENT_AMBULATORY_CARE_PROVIDER_SITE_OTHER): Payer: BLUE CROSS/BLUE SHIELD

## 2015-02-27 DIAGNOSIS — E291 Testicular hypofunction: Secondary | ICD-10-CM | POA: Diagnosis not present

## 2015-02-27 DIAGNOSIS — E349 Endocrine disorder, unspecified: Secondary | ICD-10-CM

## 2015-02-27 DIAGNOSIS — E559 Vitamin D deficiency, unspecified: Secondary | ICD-10-CM | POA: Diagnosis not present

## 2015-02-27 DIAGNOSIS — N400 Enlarged prostate without lower urinary tract symptoms: Secondary | ICD-10-CM | POA: Diagnosis not present

## 2015-02-27 DIAGNOSIS — R030 Elevated blood-pressure reading, without diagnosis of hypertension: Secondary | ICD-10-CM

## 2015-02-27 DIAGNOSIS — Z0000 Encounter for general adult medical examination without abnormal findings: Secondary | ICD-10-CM | POA: Diagnosis not present

## 2015-02-27 DIAGNOSIS — IMO0001 Reserved for inherently not codable concepts without codable children: Secondary | ICD-10-CM

## 2015-02-27 LAB — POCT CBC
GRANULOCYTE PERCENT: 58.7 % (ref 37–80)
HEMATOCRIT: 48.8 % (ref 43.5–53.7)
HEMOGLOBIN: 15.4 g/dL (ref 14.1–18.1)
Lymph, poc: 2.1 (ref 0.6–3.4)
MCH, POC: 28.3 pg (ref 27–31.2)
MCHC: 31.7 g/dL — AB (ref 31.8–35.4)
MCV: 89.4 fL (ref 80–97)
MPV: 7.8 fL (ref 0–99.8)
POC GRANULOCYTE: 3.3 (ref 2–6.9)
POC LYMPH PERCENT: 36.6 %L (ref 10–50)
Platelet Count, POC: 221 10*3/uL (ref 142–424)
RBC: 5.46 M/uL (ref 4.69–6.13)
RDW, POC: 14.1 %
WBC: 5.7 10*3/uL (ref 4.6–10.2)

## 2015-02-27 NOTE — Telephone Encounter (Signed)
Orders placed.

## 2015-03-01 LAB — BMP8+EGFR
BUN / CREAT RATIO: 14 (ref 8–19)
BUN: 15 mg/dL (ref 6–20)
CO2: 26 mmol/L (ref 18–29)
CREATININE: 1.08 mg/dL (ref 0.76–1.27)
Calcium: 9.3 mg/dL (ref 8.7–10.2)
Chloride: 98 mmol/L (ref 97–108)
GFR calc Af Amer: 99 mL/min/{1.73_m2} (ref >59)
GFR, EST NON AFRICAN AMERICAN: 86 mL/min/{1.73_m2} (ref >59)
Glucose: 109 mg/dL — ABNORMAL HIGH (ref 65–99)
POTASSIUM: 4.2 mmol/L (ref 3.5–5.2)
SODIUM: 140 mmol/L (ref 134–144)

## 2015-03-01 LAB — HEPATIC FUNCTION PANEL
ALBUMIN: 4.5 g/dL (ref 3.5–5.5)
ALT: 17 IU/L (ref 0–44)
AST: 24 IU/L (ref 0–40)
Alkaline Phosphatase: 55 IU/L (ref 39–117)
BILIRUBIN TOTAL: 0.6 mg/dL (ref 0.0–1.2)
Bilirubin, Direct: 0.16 mg/dL (ref 0.00–0.40)
TOTAL PROTEIN: 7.1 g/dL (ref 6.0–8.5)

## 2015-03-01 LAB — NMR, LIPOPROFILE
CHOLESTEROL: 176 mg/dL (ref 100–199)
HDL Cholesterol by NMR: 55 mg/dL (ref >39)
HDL PARTICLE NUMBER: 38.2 umol/L (ref >=30.5)
LDL PARTICLE NUMBER: 1193 nmol/L — AB (ref <1000)
LDL Size: 21.1 nm (ref >20.5)
LDL-C: 106 mg/dL — ABNORMAL HIGH (ref 0–99)
LP-IR SCORE: 52 — AB (ref <=45)
Small LDL Particle Number: 409 nmol/L (ref <=527)
Triglycerides by NMR: 75 mg/dL (ref 0–149)

## 2015-03-01 LAB — PSA, TOTAL AND FREE
PSA, Free Pct: 66 %
PSA, Free: 0.33 ng/mL
PSA: 0.5 ng/mL (ref 0.0–4.0)

## 2015-03-01 LAB — TESTOSTERONE,FREE AND TOTAL
TESTOSTERONE: 520 ng/dL (ref 348–1197)
Testosterone, Free: 15.3 pg/mL (ref 8.7–25.1)

## 2015-03-01 LAB — VITAMIN D 25 HYDROXY (VIT D DEFICIENCY, FRACTURES): Vit D, 25-Hydroxy: 21 ng/mL — ABNORMAL LOW (ref 30.0–100.0)

## 2015-03-06 ENCOUNTER — Encounter: Payer: Self-pay | Admitting: Family Medicine

## 2015-03-06 ENCOUNTER — Ambulatory Visit (INDEPENDENT_AMBULATORY_CARE_PROVIDER_SITE_OTHER): Payer: BLUE CROSS/BLUE SHIELD | Admitting: Family Medicine

## 2015-03-06 VITALS — BP 144/90 | HR 70 | Temp 98.9°F | Ht 73.5 in | Wt 206.0 lb

## 2015-03-06 DIAGNOSIS — N400 Enlarged prostate without lower urinary tract symptoms: Secondary | ICD-10-CM

## 2015-03-06 DIAGNOSIS — Z0000 Encounter for general adult medical examination without abnormal findings: Secondary | ICD-10-CM

## 2015-03-06 DIAGNOSIS — M1990 Unspecified osteoarthritis, unspecified site: Secondary | ICD-10-CM

## 2015-03-06 DIAGNOSIS — E349 Endocrine disorder, unspecified: Secondary | ICD-10-CM

## 2015-03-06 DIAGNOSIS — E291 Testicular hypofunction: Secondary | ICD-10-CM | POA: Diagnosis not present

## 2015-03-06 DIAGNOSIS — IMO0001 Reserved for inherently not codable concepts without codable children: Secondary | ICD-10-CM

## 2015-03-06 DIAGNOSIS — M19012 Primary osteoarthritis, left shoulder: Secondary | ICD-10-CM

## 2015-03-06 DIAGNOSIS — R030 Elevated blood-pressure reading, without diagnosis of hypertension: Secondary | ICD-10-CM

## 2015-03-06 DIAGNOSIS — E559 Vitamin D deficiency, unspecified: Secondary | ICD-10-CM

## 2015-03-06 LAB — POCT URINALYSIS DIPSTICK
BILIRUBIN UA: NEGATIVE
Blood, UA: NEGATIVE
GLUCOSE UA: NEGATIVE
Ketones, UA: NEGATIVE
Leukocytes, UA: NEGATIVE
NITRITE UA: NEGATIVE
PH UA: 8
Protein, UA: NEGATIVE
Spec Grav, UA: 1.01
UROBILINOGEN UA: NEGATIVE

## 2015-03-06 LAB — POCT UA - MICROSCOPIC ONLY
Bacteria, U Microscopic: NEGATIVE
Casts, Ur, LPF, POC: NEGATIVE
Crystals, Ur, HPF, POC: NEGATIVE
Mucus, UA: NEGATIVE
RBC, urine, microscopic: NEGATIVE
WBC, Ur, HPF, POC: NEGATIVE

## 2015-03-06 MED ORDER — TESTOSTERONE CYPIONATE 200 MG/ML IM SOLN: INTRAMUSCULAR | Status: DC

## 2015-03-06 MED ORDER — MELOXICAM 7.5 MG PO TABS: 7.5000 mg | ORAL_TABLET | Freq: Every day | ORAL | Status: DC

## 2015-03-06 MED ORDER — VITAMIN D (ERGOCALCIFEROL) 1.25 MG (50000 UNIT) PO CAPS: 50000.0000 [IU] | ORAL_CAPSULE | ORAL | Status: DC

## 2015-03-06 NOTE — Progress Notes (Signed)
Subjective:    Patient ID: Jacob Morris, male    DOB: [birth year], 39 y.o.   MRN: [id number]  HPI Pt here for follow up and management of chronic medical problems which includes hypogonadism and vit d def. He is taking medications regularly. The patient is doing well today except she does complain of some left shoulder pain. He does his on testosterone injections every 10 days and this is 100 mg total every 10 days IM. The patient indicates that his shoulder does not bother him all the time just occasionally. He denies chest pain shortness of breath GI symptoms or voiding symptoms. He does indicate that his mother has high blood pressure.          Patient Active Problem List   Diagnosis Date Noted  . Testosterone deficiency 03/06/2014  . Hypogonadism male 03/04/2013   Outpatient Encounter Prescriptions as of 03/06/2015  Medication Sig  . B-D 3CC LUER-LOK SYR 22GX1-1/2 22G X 1-1/2" 3 ML MISC USE TO INJECT TESTOSTERONE EVERY TEN DAYS  . SYRINGE-NEEDLE, DISP, 3 ML 21G X 1" 3 ML MISC Use to inject testosterone every 10 days  . testosterone cypionate (DEPOTESTOTERONE CYPIONATE) 200 MG/ML injection INJECT 0.5ML INTO MUSCLE ONCE EVERY 10 DAYS  . [DISCONTINUED] meloxicam (MOBIC) 7.5 MG tablet Take 1 tablet (7.5 mg total) by mouth daily.    Review of Systems  Constitutional: Negative.   HENT: Negative.   Eyes: Negative.   Respiratory: Negative.   Cardiovascular: Negative.   Gastrointestinal: Negative.   Endocrine: Negative.   Genitourinary: Negative.   Musculoskeletal: Positive for arthralgias (left arm nerve pain and shoulder pain).  Skin: Negative.   Allergic/Immunologic: Negative.   Neurological: Negative.   Hematological: Negative.   Psychiatric/Behavioral: Negative.        Objective:   Physical Exam  Constitutional: He is oriented to person, place, and time. He appears well-developed and well-nourished.  HENT:  Head: Normocephalic and atraumatic.  Right Ear: External  ear normal.  Left Ear: External ear normal.  Nose: Nose normal.  Mouth/Throat: Oropharynx is clear and moist. No oropharyngeal exudate.  Eyes: Conjunctivae and EOM are normal. Pupils are equal, round, and reactive to light. Right eye exhibits no discharge. Left eye exhibits no discharge. No scleral icterus.  Neck: Normal range of motion. Neck supple. No thyromegaly present.  No adenopathy or bruits  Cardiovascular: Normal rate, regular rhythm, normal heart sounds and intact distal pulses.   No murmur heard. At 72/m  Pulmonary/Chest: Effort normal and breath sounds normal. No respiratory distress. He has no wheezes. He has no rales. He exhibits no tenderness.  Abdominal: Soft. Bowel sounds are normal. He exhibits no mass. There is no tenderness. There is no rebound and no guarding.  Genitourinary: Rectum normal, prostate normal and penis normal.  Musculoskeletal: Normal range of motion. He exhibits no edema or tenderness.  The shoulder had good range of motion and there was no palpable tenderness or masses.  Lymphadenopathy:    He has no cervical adenopathy.  Neurological: He is alert and oriented to person, place, and time. He has normal reflexes. No cranial nerve deficit.  Skin: Skin is warm and dry. No rash noted. No erythema. No pallor.  Psychiatric: He has a normal mood and affect. His behavior is normal. Judgment and thought content normal.  Nursing note and vitals reviewed.  BP 144/90 mmHg  Pulse 70  Temp(Src) 98.9 F (37.2 C) (Oral)  Ht 6' 1.5" (1.867 m)  Wt 206 lb (93.441 kg)  BMI 26.81 kg/m2  Repeat blood pressure in the right arm sitting was 140/100      Assessment & Plan:  1. Testosterone deficiency -The patient's testosterone level, CBC, and PSA were all stable and he should continue with current injections of 100 mg every 10 days - POCT urinalysis dipstick - POCT UA - Microscopic Only  2. Healthcare maintenance -The patient should continue with aggressive  therapeutic lifestyle changes and diet habits  3. Vitamin D deficiency -He will start vitamin D 50,000 units weekly  4. Elevated blood pressure -He will watch his sodium intake and bring blood pressure readings by for review in 2-3 weeks  5. BPH (benign prostatic hyperplasia) -The prostate is only minimally enlarged and hot there were no lumps or masses and no adverse effects from taking the testosterone - POCT urinalysis dipstick - POCT UA - Microscopic Only  6. Arthritis of left acromioclavicular joint -The patient she use some warm wet compresses to the shoulder and take the medicine for short period time for 3-4 weeks on a regular basis - meloxicam (MOBIC) 7.5 MG tablet; Take 1 tablet (7.5 mg total) by mouth daily.  Dispense: 30 tablet; Refill: 0  Meds ordered this encounter  Medications  . Vitamin D, Ergocalciferol, (DRISDOL) 50000 UNITS CAPS capsule    Sig: Take 1 capsule (50,000 Units total) by mouth every 7 (seven) days.    Dispense:  12 capsule    Refill:  1  . testosterone cypionate (DEPOTESTOTERONE CYPIONATE) 200 MG/ML injection    Sig: INJECT 0.5ML INTO MUSCLE ONCE EVERY 10 DAYS    Dispense:  10 mL    Refill:  5    This request is for a new prescription for a controlled substance as required by Federal/State law..  . meloxicam (MOBIC) 7.5 MG tablet    Sig: Take 1 tablet (7.5 mg total) by mouth daily.    Dispense:  30 tablet    Refill:  0   Patient Instructions  Continue current medications. Continue good therapeutic lifestyle changes which include good diet and exercise. Fall precautions discussed with patient. If an FOBT was given today- please return it to our front desk.  Flu Shots are still available at our office. If you still haven't had one please call to set up a nurse visit to get one.   After your visit with [place] today you will receive a survey in the mail or online from [employer] regarding your care with [place]. Please take a moment to fill this out. Your  feedback is very important to [place] as you can help [place] better understand your patient needs as well as improve your experience and satisfaction. WE CARE ABOUT YOU!!!  The patient should continue to watch his sodium intake closely He should take the anti-inflammatory medicine that is being called and one daily after eating and he should discontinue this if it bothers his stomach He should return blood pressure readings for review in 2-3 weeks Stay active physically and detached yourself from the stresses at work as much as possible Take vitamin D 50,000 units weekly until we recheck the level the next time.   Arrie Senate MD

## 2015-03-06 NOTE — Patient Instructions (Addendum)
Continue current medications. Continue good therapeutic lifestyle changes which include good diet and exercise. Fall precautions discussed with patient. If an FOBT was given today- please return it to our front desk.  Flu Shots are still available at our office. If you still haven't had one please call to set up a nurse visit to get one.   After your visit with [place] today you will receive a survey in the mail or online from [employer] regarding your care with [place]. Please take a moment to fill this out. Your feedback is very important to [place] as you can help [place] better understand your patient needs as well as improve your experience and satisfaction. WE CARE ABOUT YOU!!!  The patient should continue to watch his sodium intake closely He should take the anti-inflammatory medicine that is being called and one daily after eating and he should discontinue this if it bothers his stomach He should return blood pressure readings for review in 2-3 weeks Stay active physically and detached yourself from the stresses at work as much as possible Take vitamin D 50,000 units weekly until we recheck the level the next time.

## 2015-04-03 ENCOUNTER — Other Ambulatory Visit: Payer: Self-pay | Admitting: Family Medicine

## 2015-09-11 ENCOUNTER — Encounter: Payer: Self-pay | Admitting: Family Medicine

## 2015-09-11 ENCOUNTER — Ambulatory Visit (INDEPENDENT_AMBULATORY_CARE_PROVIDER_SITE_OTHER): Payer: BLUE CROSS/BLUE SHIELD | Admitting: Family Medicine

## 2015-09-11 ENCOUNTER — Other Ambulatory Visit: Payer: Self-pay | Admitting: Family Medicine

## 2015-09-11 VITALS — BP 152/86 | HR 56 | Temp 96.1°F | Ht 73.5 in | Wt 195.0 lb

## 2015-09-11 DIAGNOSIS — N400 Enlarged prostate without lower urinary tract symptoms: Secondary | ICD-10-CM

## 2015-09-11 DIAGNOSIS — Z0000 Encounter for general adult medical examination without abnormal findings: Secondary | ICD-10-CM | POA: Diagnosis not present

## 2015-09-11 DIAGNOSIS — E349 Endocrine disorder, unspecified: Secondary | ICD-10-CM

## 2015-09-11 DIAGNOSIS — E559 Vitamin D deficiency, unspecified: Secondary | ICD-10-CM

## 2015-09-11 LAB — POCT URINALYSIS DIPSTICK
Bilirubin, UA: NEGATIVE
GLUCOSE UA: NEGATIVE
Ketones, UA: NEGATIVE
Leukocytes, UA: NEGATIVE
NITRITE UA: NEGATIVE
PH UA: 8
PROTEIN UA: NEGATIVE
RBC UA: NEGATIVE
Spec Grav, UA: <=1.005
UROBILINOGEN UA: NEGATIVE

## 2015-09-11 LAB — POCT UA - MICROSCOPIC ONLY
BACTERIA, U MICROSCOPIC: NEGATIVE
CASTS, UR, LPF, POC: NEGATIVE
Crystals, Ur, HPF, POC: NEGATIVE
Epithelial cells, urine per micros: NEGATIVE
Mucus, UA: NEGATIVE
RBC, urine, microscopic: NEGATIVE
WBC, Ur, HPF, POC: NEGATIVE
YEAST UA: NEGATIVE

## 2015-09-11 NOTE — Addendum Note (Signed)
Addended by: Earlene Plater on: 09/11/2015 09:12 AM   Modules accepted: Miquel Dunn

## 2015-09-11 NOTE — Progress Notes (Signed)
Subjective:    Patient ID: Jacob Morris, male    DOB: [birth year], 39 y.o.   MRN: [id number]  HPI Patient is here today for annual wellness exam and follow up of chronic medical problems which includes testosterone def. He is taking medications regularly. The patient has no complaints today and he is been injecting his testosterone regularly without problems he continues to take his vitamin D replacement. The patient denies chest pain shortness of breath trouble swallowing and heartburn indigestion nausea vomiting diarrhea or blood in the stool. He did not describe any fatigue and has been passing his water without problems and performing sexually without problems. He is following his diet closely and eating a lot of gr and fresh vegetables and no meat. He does monitor his blood pressures at home and these are always excellent and I reviewed some of these with him on his eye fine. He will bring some readings back for [place] as well as his on monitor to this office so that we can correlate his monitor with our readings.       Patient Active Problem List   Diagnosis Date Noted  . Testosterone deficiency 03/06/2014  . Hypogonadism male 03/04/2013   Outpatient Encounter Prescriptions as of 09/11/2015  Medication Sig  . B-D 3CC LUER-LOK SYR 22GX1-1/2 22G X 1-1/2" 3 ML MISC USE TO INJECT TESTOSTERONE EVERY TEN DAYS  . SYRINGE-NEEDLE, DISP, 3 ML 21G X 1" 3 ML MISC Use to inject testosterone every 10 days  . testosterone cypionate (DEPOTESTOTERONE CYPIONATE) 200 MG/ML injection INJECT 0.5ML INTO MUSCLE ONCE EVERY 10 DAYS  . Vitamin D, Ergocalciferol, (DRISDOL) 50000 UNITS CAPS capsule Take 1 capsule (50,000 Units total) by mouth every 7 (seven) days.  . [DISCONTINUED] meloxicam (MOBIC) 7.5 MG tablet TAKE 1 TABLET (7.5 MG TOTAL) BY MOUTH DAILY.   No facility-administered encounter medications on file as of 09/11/2015.      Review of Systems  Constitutional: Negative.   HENT: Negative.   Eyes:  Negative.   Respiratory: Negative.   Cardiovascular: Negative.   Gastrointestinal: Negative.   Endocrine: Negative.   Genitourinary: Negative.   Musculoskeletal: Negative.   Skin: Negative.   Allergic/Immunologic: Negative.   Neurological: Negative.   Hematological: Negative.   Psychiatric/Behavioral: Negative.        Objective:   Physical Exam  Constitutional: He is oriented to person, place, and time. He appears well-developed and well-nourished.  HENT:  Head: Normocephalic and atraumatic.  Right Ear: External ear normal.  Left Ear: External ear normal.  Nose: Nose normal.  Mouth/Throat: Oropharynx is clear and moist. No oropharyngeal exudate.  Eyes: Conjunctivae and EOM are normal. Pupils are equal, round, and reactive to light. Right eye exhibits no discharge. Left eye exhibits no discharge. No scleral icterus.  Neck: Normal range of motion. Neck supple. No tracheal deviation present. No thyromegaly present.  Cardiovascular: Normal rate, regular rhythm, normal heart sounds and intact distal pulses.   No murmur heard. At 60/m  Pulmonary/Chest: Effort normal and breath sounds normal. No respiratory distress. He has no wheezes. He has no rales. He exhibits no tenderness.  Clear anteriorly and posteriorly  Abdominal: Soft. Bowel sounds are normal. He exhibits no mass. There is no tenderness. There is no rebound and no guarding.  No inguinal adenopathy organ enlargement or bruits  Genitourinary: Rectum normal and penis normal.  The prostate is slightly enlarged but with no lumps or masses. The rectum was clear of masses. The external genitalia were normal and there  were no hernias palpated.  Musculoskeletal: Normal range of motion. He exhibits no edema or tenderness.  Lymphadenopathy:    He has no cervical adenopathy.  Neurological: He is alert and oriented to person, place, and time. He has normal reflexes. No cranial nerve deficit.  Skin: Skin is warm and dry. No rash noted.  No erythema. No pallor.  Psychiatric: He has a normal mood and affect. His behavior is normal. Judgment and thought content normal.  Nursing note and vitals reviewed.  BP 153/89 mmHg  Pulse 56  Temp(Src) 96.1 F (35.6 C) (Oral)  Ht 6' 1.5" (1.867 m)  Wt 195 lb (88.451 kg)  BMI 25.38 kg/m2        Assessment & Plan:  1. Annual physical exam -FOBT in November - CBC with Differential/Platelet - Hepatic function panel - NMR, lipoprofile - Testosterone,Free and Total - Vit D  25 hydroxy (rtn osteoporosis monitoring) - PSA - POCT UA - Microscopic Only - POCT urinalysis dipstick  2. Testosterone deficiency -Continue current treatment - CBC with Differential/Platelet - Testosterone,Free and Total - PSA - POCT UA - Microscopic Only - POCT urinalysis dipstick  3. Vitamin D deficiency -Continue current treatment pending results of lab work - CBC with Differential/Platelet - Vit D  25 hydroxy (rtn osteoporosis monitoring)  4. BPH (benign prostatic hyperplasia) -This is stable the patient is having no problems with voiding. - CBC with Differential/Platelet - Testosterone,Free and Total - PSA - POCT UA - Microscopic Only - POCT urinalysis dipstick  No orders of the defined types were placed in this encounter.   Patient Instructions  Continue current medications. Continue good therapeutic lifestyle changes which include good diet and exercise. Fall precautions discussed with patient. If an FOBT was given today- please return it to our front desk. If you are over 52 years old - you may need Prevnar 39 or the adult Pneumonia vaccine.  **Flu shots will be available soon--- please call and schedule a FLU-CLINIC appointment**  After your visit with [place] today you will receive a survey in the mail or online from [employer] regarding your care with [place]. Please take a moment to fill this out. Your feedback is very important to [place] as you can help [place] better understand your patient  needs as well as improve your experience and satisfaction. WE CARE ABOUT YOU!!!   The patient should continue to exercise regularly and follow his diet as he is doing now This winter he should drink plenty of fluids and stay well hydrated using nasal saline and Mucinex for cough and congestion as needed He should continue with his current testosterone replacement pending results of lab work but will be doing today.    Arrie Senate MD

## 2015-09-11 NOTE — Patient Instructions (Addendum)
Continue current medications. Continue good therapeutic lifestyle changes which include good diet and exercise. Fall precautions discussed with patient. If an FOBT was given today- please return it to our front desk. If you are over 40 years old - you may need Prevnar 48 or the adult Pneumonia vaccine.  **Flu shots will be available soon--- please call and schedule a FLU-CLINIC appointment**  After your visit with [place] today you will receive a survey in the mail or online from [employer] regarding your care with [place]. Please take a moment to fill this out. Your feedback is very important to [place] as you can help [place] better understand your patient needs as well as improve your experience and satisfaction. WE CARE ABOUT YOU!!!   The patient should continue to exercise regularly and follow his diet as he is doing now This winter he should drink plenty of fluids and stay well hydrated using nasal saline and Mucinex for cough and congestion as needed He should continue with his current testosterone replacement pending results of lab work but will be doing today.

## 2015-09-12 LAB — CBC WITH DIFFERENTIAL/PLATELET
BASOS: 0 %
Basophils Absolute: 0 10*3/uL (ref 0.0–0.2)
EOS (ABSOLUTE): 0.1 10*3/uL (ref 0.0–0.4)
Eos: 3 %
Hematocrit: 46.8 % (ref 37.5–51.0)
Hemoglobin: 15.3 g/dL (ref 12.6–17.7)
Immature Grans (Abs): 0 10*3/uL (ref 0.0–0.1)
Immature Granulocytes: 0 %
Lymphocytes Absolute: 1.7 10*3/uL (ref 0.7–3.1)
Lymphs: 38 %
MCH: 29.3 pg (ref 26.6–33.0)
MCHC: 32.7 g/dL (ref 31.5–35.7)
MCV: 90 fL (ref 79–97)
MONOS ABS: 0.3 10*3/uL (ref 0.1–0.9)
Monocytes: 7 %
NEUTROS ABS: 2.3 10*3/uL (ref 1.4–7.0)
Neutrophils: 52 %
PLATELETS: 218 10*3/uL (ref 150–379)
RBC: 5.23 x10E6/uL (ref 4.14–5.80)
RDW: 13.4 % (ref 12.3–15.4)
WBC: 4.4 10*3/uL (ref 3.4–10.8)

## 2015-09-12 LAB — NMR, LIPOPROFILE
Cholesterol: 161 mg/dL (ref 100–199)
HDL CHOLESTEROL BY NMR: 43 mg/dL (ref >39)
HDL Particle Number: 32.2 umol/L (ref >=30.5)
LDL Particle Number: 956 nmol/L (ref <1000)
LDL SIZE: 21.2 nm (ref >20.5)
LDL-C: 84 mg/dL (ref 0–99)
LP-IR Score: 42 (ref <=45)
Small LDL Particle Number: 372 nmol/L (ref <=527)
TRIGLYCERIDES BY NMR: 172 mg/dL — AB (ref 0–149)

## 2015-09-12 LAB — HEPATIC FUNCTION PANEL
ALT: 20 IU/L (ref 0–44)
AST: 21 IU/L (ref 0–40)
Albumin: 4.7 g/dL (ref 3.5–5.5)
Alkaline Phosphatase: 43 IU/L (ref 39–117)
Bilirubin Total: 0.7 mg/dL (ref 0.0–1.2)
Bilirubin, Direct: 0.21 mg/dL (ref 0.00–0.40)
Total Protein: 7.1 g/dL (ref 6.0–8.5)

## 2015-09-12 LAB — TESTOSTERONE,FREE AND TOTAL
TESTOSTERONE FREE: 8.5 pg/mL (ref 6.8–21.5)
Testosterone: 350 ng/dL (ref 348–1197)

## 2015-09-12 LAB — PSA: PROSTATE SPECIFIC AG, SERUM: 0.5 ng/mL (ref 0.0–4.0)

## 2015-09-12 LAB — VITAMIN D 25 HYDROXY (VIT D DEFICIENCY, FRACTURES): Vit D, 25-Hydroxy: 31.8 ng/mL (ref 30.0–100.0)

## 2015-09-12 NOTE — Telephone Encounter (Signed)
Last seen and last VIT D DWM  Results are not in yet

## 2015-09-17 ENCOUNTER — Other Ambulatory Visit: Payer: BLUE CROSS/BLUE SHIELD

## 2015-09-17 ENCOUNTER — Other Ambulatory Visit: Payer: Self-pay | Admitting: Family Medicine

## 2015-09-17 DIAGNOSIS — Z1212 Encounter for screening for malignant neoplasm of rectum: Secondary | ICD-10-CM

## 2015-09-17 NOTE — Progress Notes (Signed)
Lab only 

## 2015-09-18 NOTE — Telephone Encounter (Signed)
Last seen 10/4./16 DWM  If approved route to nurse to call into [facility]

## 2015-09-19 LAB — FECAL OCCULT BLOOD, IMMUNOCHEMICAL: Fecal Occult Bld: NEGATIVE

## 2016-02-20 ENCOUNTER — Telehealth: Payer: Self-pay | Admitting: Family Medicine

## 2016-02-20 DIAGNOSIS — E291 Testicular hypofunction: Secondary | ICD-10-CM

## 2016-02-20 DIAGNOSIS — E559 Vitamin D deficiency, unspecified: Secondary | ICD-10-CM

## 2016-02-20 DIAGNOSIS — E349 Endocrine disorder, unspecified: Secondary | ICD-10-CM

## 2016-02-20 DIAGNOSIS — Z0000 Encounter for general adult medical examination without abnormal findings: Secondary | ICD-10-CM

## 2016-02-20 NOTE — Telephone Encounter (Signed)
Patient aware that lab orders have been placed

## 2016-03-10 ENCOUNTER — Other Ambulatory Visit: Payer: BLUE CROSS/BLUE SHIELD

## 2016-03-10 DIAGNOSIS — E291 Testicular hypofunction: Secondary | ICD-10-CM

## 2016-03-10 DIAGNOSIS — E559 Vitamin D deficiency, unspecified: Secondary | ICD-10-CM

## 2016-03-10 DIAGNOSIS — Z0000 Encounter for general adult medical examination without abnormal findings: Secondary | ICD-10-CM

## 2016-03-10 DIAGNOSIS — E349 Endocrine disorder, unspecified: Secondary | ICD-10-CM

## 2016-03-11 LAB — CBC WITH DIFFERENTIAL/PLATELET
Basophils Absolute: 0 10*3/uL (ref 0.0–0.2)
Basos: 0 %
EOS (ABSOLUTE): 0.2 10*3/uL (ref 0.0–0.4)
EOS: 4 %
HEMATOCRIT: 43.4 % (ref 37.5–51.0)
HEMOGLOBIN: 14.9 g/dL (ref 12.6–17.7)
Immature Grans (Abs): 0 10*3/uL (ref 0.0–0.1)
Immature Granulocytes: 0 %
LYMPHS ABS: 2.2 10*3/uL (ref 0.7–3.1)
Lymphs: 47 %
MCH: 30.2 pg (ref 26.6–33.0)
MCHC: 34.3 g/dL (ref 31.5–35.7)
MCV: 88 fL (ref 79–97)
MONOCYTES: 10 %
MONOS ABS: 0.5 10*3/uL (ref 0.1–0.9)
NEUTROS ABS: 1.8 10*3/uL (ref 1.4–7.0)
Neutrophils: 39 %
Platelets: 245 10*3/uL (ref 150–379)
RBC: 4.94 x10E6/uL (ref 4.14–5.80)
RDW: 14.5 % (ref 12.3–15.4)
WBC: 4.8 10*3/uL (ref 3.4–10.8)

## 2016-03-11 LAB — TESTOSTERONE,FREE AND TOTAL
TESTOSTERONE: 212 ng/dL — AB (ref 348–1197)
Testosterone, Free: 6.5 pg/mL — ABNORMAL LOW (ref 6.8–21.5)

## 2016-03-11 LAB — PSA, TOTAL AND FREE
PSA FREE PCT: 64 %
PSA FREE: 0.32 ng/mL
Prostate Specific Ag, Serum: 0.5 ng/mL (ref 0.0–4.0)

## 2016-03-11 LAB — BMP8+EGFR
BUN / CREAT RATIO: 10 (ref 9–20)
BUN: 10 mg/dL (ref 6–24)
CHLORIDE: 102 mmol/L (ref 96–106)
CO2: 26 mmol/L (ref 18–29)
CREATININE: 0.99 mg/dL (ref 0.76–1.27)
Calcium: 9.5 mg/dL (ref 8.7–10.2)
GFR calc Af Amer: 110 mL/min/{1.73_m2} (ref >59)
GFR calc non Af Amer: 95 mL/min/{1.73_m2} (ref >59)
GLUCOSE: 108 mg/dL — AB (ref 65–99)
POTASSIUM: 4.7 mmol/L (ref 3.5–5.2)
SODIUM: 145 mmol/L — AB (ref 134–144)

## 2016-03-11 LAB — NMR, LIPOPROFILE
CHOLESTEROL: 160 mg/dL (ref 100–199)
HDL Cholesterol by NMR: 43 mg/dL (ref >39)
HDL PARTICLE NUMBER: 32.5 umol/L (ref >=30.5)
LDL PARTICLE NUMBER: 943 nmol/L (ref <1000)
LDL SIZE: 21.7 nm (ref >20.5)
LDL-C: 102 mg/dL — AB (ref 0–99)
LP-IR SCORE: 41 (ref <=45)
Small LDL Particle Number: 222 nmol/L (ref <=527)
Triglycerides by NMR: 77 mg/dL (ref 0–149)

## 2016-03-11 LAB — HEPATIC FUNCTION PANEL
ALK PHOS: 49 IU/L (ref 39–117)
ALT: 13 IU/L (ref 0–44)
AST: 20 IU/L (ref 0–40)
Albumin: 4.7 g/dL (ref 3.5–5.5)
Bilirubin Total: 0.5 mg/dL (ref 0.0–1.2)
Bilirubin, Direct: 0.15 mg/dL (ref 0.00–0.40)
Total Protein: 6.9 g/dL (ref 6.0–8.5)

## 2016-03-11 LAB — VITAMIN D 25 HYDROXY (VIT D DEFICIENCY, FRACTURES): VIT D 25 HYDROXY: 21.8 ng/mL — AB (ref 30.0–100.0)

## 2016-03-14 ENCOUNTER — Encounter: Payer: Self-pay | Admitting: Family Medicine

## 2016-03-14 ENCOUNTER — Ambulatory Visit (INDEPENDENT_AMBULATORY_CARE_PROVIDER_SITE_OTHER): Payer: BLUE CROSS/BLUE SHIELD | Admitting: Family Medicine

## 2016-03-14 VITALS — BP 146/75 | HR 65 | Temp 97.5°F | Ht 73.5 in | Wt 200.0 lb

## 2016-03-14 DIAGNOSIS — E349 Endocrine disorder, unspecified: Secondary | ICD-10-CM

## 2016-03-14 DIAGNOSIS — R030 Elevated blood-pressure reading, without diagnosis of hypertension: Secondary | ICD-10-CM

## 2016-03-14 DIAGNOSIS — N400 Enlarged prostate without lower urinary tract symptoms: Secondary | ICD-10-CM | POA: Diagnosis not present

## 2016-03-14 DIAGNOSIS — IMO0001 Reserved for inherently not codable concepts without codable children: Secondary | ICD-10-CM

## 2016-03-14 DIAGNOSIS — E559 Vitamin D deficiency, unspecified: Secondary | ICD-10-CM

## 2016-03-14 DIAGNOSIS — E291 Testicular hypofunction: Secondary | ICD-10-CM

## 2016-03-14 LAB — URINALYSIS, COMPLETE
Bilirubin, UA: NEGATIVE
Glucose, UA: NEGATIVE
KETONES UA: NEGATIVE
Leukocytes, UA: NEGATIVE
NITRITE UA: NEGATIVE
Protein, UA: NEGATIVE
RBC, UA: NEGATIVE
SPEC GRAV UA: 1.01 (ref 1.005–1.030)
Urobilinogen, Ur: 0.2 mg/dL (ref 0.2–1.0)
pH, UA: 7 (ref 5.0–7.5)

## 2016-03-14 LAB — MICROSCOPIC EXAMINATION
BACTERIA UA: NONE SEEN
Epithelial Cells (non renal): NONE SEEN /hpf (ref 0–10)
RBC, UA: NONE SEEN /hpf (ref 0–?)

## 2016-03-14 MED ORDER — VITAMIN D (ERGOCALCIFEROL) 1.25 MG (50000 UNIT) PO CAPS: ORAL_CAPSULE | ORAL | Status: DC

## 2016-03-14 MED ORDER — TESTOSTERONE CYPIONATE 200 MG/ML IM SOLN: INTRAMUSCULAR | Status: DC

## 2016-03-14 NOTE — Progress Notes (Signed)
Subjective:    Patient ID: Jacob Morris, male    DOB: 12-May-1976, 40 y.o.   MRN: [id number]  HPI Pt here for follow up and management of chronic medical problems which includes elevated Bp and testosterone def. He is taking medications regularly.The patient indicates he has not been taking his vitamin D regularly and this explains why it is low this time. He also says that he gives himself is on testosterone injections and he specifically waited to have the blood drawn toward the end of the period of time and set up in the middle of the time between injections. This also explains why his testosterone was low this time. He's been very active physically and exercising regularly. He eats a lot of vegetables. We did review the lab work with him and the only problems with the lab work were explained by the fact of his not taking the vitamin D and having the testosterone level drawn just prior to the next injection. He denies any chest pain shortness of breath trouble swallowing heartburn indigestion nausea vomiting diarrhea or blood in the stool. He is passing his water without problems. He does have some pain in his knees and he attributes this to the running that he does. He gets better when he rests for a few days. I suggested that if he continues to have problems that he should take an occasional ibuprofen this will probably take care of some of the discomfort.     Patient Active Problem List   Diagnosis Date Noted  . Testosterone deficiency 03/06/2014  . Hypogonadism male 03/04/2013   Outpatient Encounter Prescriptions as of 03/14/2016  Medication Sig  . B-D 3CC LUER-LOK SYR 22GX1-1/2 22G X 1-1/2" 3 ML MISC USE TO INJECT TESTOSTERONE EVERY TEN DAYS  . SYRINGE-NEEDLE, DISP, 3 ML 21G X 1" 3 ML MISC Use to inject testosterone every 10 days  . testosterone cypionate (DEPOTESTOSTERONE CYPIONATE) 200 MG/ML injection INJECT 0.5 ML INTO THE MUSCLE EVERY 10 DAYS  . Vitamin D, Ergocalciferol, (DRISDOL)  50000 UNITS CAPS capsule TAKE 1 CAPSULE (50,000 UNITS TOTAL) BY MOUTH EVERY 7 (SEVEN) DAYS.   No facility-administered encounter medications on file as of 03/14/2016.      Review of Systems  Constitutional: Negative.   HENT: Negative.   Eyes: Negative.   Respiratory: Negative.   Cardiovascular: Negative.   Gastrointestinal: Negative.   Endocrine: Negative.   Genitourinary: Negative.   Musculoskeletal: Positive for arthralgias.  Skin: Negative.   Allergic/Immunologic: Negative.   Neurological: Negative.   Hematological: Negative.   Psychiatric/Behavioral: Negative.        Objective:   Physical Exam  Constitutional: He is oriented to person, place, and time. He appears well-developed and well-nourished. No distress.  HENT:  Head: Normocephalic and atraumatic.  Right Ear: External ear normal.  Left Ear: External ear normal.  Nose: Nose normal.  Mouth/Throat: Oropharynx is clear and moist. No oropharyngeal exudate.  Eyes: Conjunctivae and EOM are normal. Pupils are equal, round, and reactive to light. Right eye exhibits no discharge. Left eye exhibits no discharge. No scleral icterus.  Neck: Normal range of motion. Neck supple. No thyromegaly present.  No thyromegaly or carotid bruits  Cardiovascular: Normal rate, regular rhythm, normal heart sounds and intact distal pulses.   No murmur heard. At 72/m  Pulmonary/Chest: Effort normal and breath sounds normal. No respiratory distress. He has no wheezes. He has no rales. He exhibits no tenderness.  Clear anteriorly and posteriorly  Abdominal: Soft. Bowel sounds are  normal. He exhibits no mass. There is no tenderness. There is no rebound and no guarding.  No abdominal tenderness or organ enlargement  Genitourinary: Rectum normal, prostate normal and penis normal.  The prostate is enlarged at all is minimally so and there are no lumps or masses. The rectal exam was negative. There are no inguinal hernias palpable. Genitalia were  within normal limits.  Musculoskeletal: Normal range of motion. He exhibits no edema.  Lymphadenopathy:    He has no cervical adenopathy.  Neurological: He is alert and oriented to person, place, and time. He has normal reflexes. No cranial nerve deficit.  Skin: Skin is warm and dry. No rash noted.  Psychiatric: He has a normal mood and affect. His behavior is normal. Judgment and thought content normal.  Nursing note and vitals reviewed.   BP 146/75 mmHg  Pulse 65  Temp(Src) 97.5 F (36.4 C) (Oral)  Ht 6' 1.5" (1.867 m)  Wt 200 lb (90.719 kg)  BMI 26.03 kg/m2  The patient brings in home blood pressures for review and all of these were good. In general he says they're higher when he is at work and lower 20s at home.     Assessment & Plan:  1. Testosterone deficiency -All lab work is good but the testosterone levels were low and the explanation was provided for them being low because the patient had blood work drawn at the end of the cycle instead of the middle of the cycle of his injections. No change in treatment. He will continue injections every 10 days. - Urinalysis, Complete  2. Vitamin D deficiency -The patient will restart his vitamin D intake it regularly  3. BPH (benign prostatic hyperplasia) -There is minimal if any prostate enlargement and on the physical exam today there were no lumps or masses. - Urinalysis, Complete  4. Elevated blood pressure -Home blood pressures brought in for review are all good and the repeat blood pressure in the office was also good. We will have him continue to check blood pressure readings at home and bring these in for review each time. -He should continue to watch his sodium intake.   Current outpatient prescriptions:  .  B-D 3CC LUER-LOK SYR 22GX1-1/2 22G X 1-1/2" 3 ML MISC, USE TO INJECT TESTOSTERONE EVERY TEN DAYS, Disp: 50 each, Rfl: 0 .  SYRINGE-NEEDLE, DISP, 3 ML 21G X 1" 3 ML MISC, Use to inject testosterone every 10 days,  Disp: 50 each, Rfl: 0 .  testosterone cypionate (DEPOTESTOSTERONE CYPIONATE) 200 MG/ML injection, INJECT 0.5 ML INTO THE MUSCLE EVERY 10 DAYS, Disp: 6 mL, Rfl: 1 .  Vitamin D, Ergocalciferol, (DRISDOL) 50000 units CAPS capsule, TAKE 1 CAPSULE (50,000 UNITS TOTAL) BY MOUTH EVERY 7 (SEVEN) DAYS., Disp: 12 capsule, Rfl: 3 Patient Instructions  Continue current medications. Continue good therapeutic lifestyle changes which include good diet and exercise. Fall precautions discussed with patient. If an FOBT was given today- please return it to our front desk. If you are over 60 years old - you may need Prevnar 41 or the adult Pneumonia vaccine.  **Flu shots are available--- please call and schedule a FLU-CLINIC appointment**  After your visit with [place] today you will receive a survey in the mail or online from [employer] regarding your care with [place]. Please take a moment to fill this out. Your feedback is very important to [place] as you can help [place] better understand your patient needs as well as improve your experience and satisfaction. WE CARE ABOUT  YOU!!!   Continue taking testosterone injections as always and have your lab work done halfway between the injections Continue drinking plenty of fluids and stay active physically as you are doing Restart the vitamin D and take this regularly   Arrie Senate MD -

## 2016-03-14 NOTE — Patient Instructions (Addendum)
Continue current medications. Continue good therapeutic lifestyle changes which include good diet and exercise. Fall precautions discussed with patient. If an FOBT was given today- please return it to our front desk. If you are over 41 years old - you may need Prevnar 63 or the adult Pneumonia vaccine.  **Flu shots are available--- please call and schedule a FLU-CLINIC appointment**  After your visit with [place] today you will receive a survey in the mail or online from [employer] regarding your care with [place]. Please take a moment to fill this out. Your feedback is very important to [place] as you can help [place] better understand your patient needs as well as improve your experience and satisfaction. WE CARE ABOUT YOU!!!   Continue taking testosterone injections as always and have your lab work done halfway between the injections Continue drinking plenty of fluids and stay active physically as you are doing Restart the vitamin D and take this regularly Continue monitoring blood pressures periodically at home and watch sodium intake

## 2016-03-18 ENCOUNTER — Ambulatory Visit: Payer: BLUE CROSS/BLUE SHIELD | Admitting: Family Medicine

## 2016-04-28 ENCOUNTER — Other Ambulatory Visit: Payer: Self-pay | Admitting: Family Medicine

## 2016-04-29 NOTE — Telephone Encounter (Signed)
Pt aware this med was phoned in

## 2016-04-29 NOTE — Telephone Encounter (Signed)
Last seen 03/14/16 DWM  If approved route to nurse to call into [facility]

## 2016-09-11 ENCOUNTER — Encounter: Payer: BLUE CROSS/BLUE SHIELD | Admitting: Family Medicine

## 2016-10-15 ENCOUNTER — Encounter: Payer: BLUE CROSS/BLUE SHIELD | Admitting: Family Medicine

## 2016-11-05 ENCOUNTER — Ambulatory Visit (INDEPENDENT_AMBULATORY_CARE_PROVIDER_SITE_OTHER): Payer: BLUE CROSS/BLUE SHIELD | Admitting: Family Medicine

## 2016-11-05 ENCOUNTER — Ambulatory Visit (INDEPENDENT_AMBULATORY_CARE_PROVIDER_SITE_OTHER): Payer: BLUE CROSS/BLUE SHIELD

## 2016-11-05 ENCOUNTER — Encounter: Payer: Self-pay | Admitting: Family Medicine

## 2016-11-05 VITALS — BP 126/78 | HR 51 | Temp 97.6°F | Ht 73.5 in | Wt 194.0 lb

## 2016-11-05 DIAGNOSIS — E875 Hyperkalemia: Secondary | ICD-10-CM

## 2016-11-05 DIAGNOSIS — Z0000 Encounter for general adult medical examination without abnormal findings: Secondary | ICD-10-CM | POA: Diagnosis not present

## 2016-11-05 DIAGNOSIS — D2271 Melanocytic nevi of right lower limb, including hip: Secondary | ICD-10-CM

## 2016-11-05 DIAGNOSIS — N400 Enlarged prostate without lower urinary tract symptoms: Secondary | ICD-10-CM | POA: Diagnosis not present

## 2016-11-05 DIAGNOSIS — E291 Testicular hypofunction: Secondary | ICD-10-CM | POA: Diagnosis not present

## 2016-11-05 DIAGNOSIS — E349 Endocrine disorder, unspecified: Secondary | ICD-10-CM | POA: Diagnosis not present

## 2016-11-05 DIAGNOSIS — E559 Vitamin D deficiency, unspecified: Secondary | ICD-10-CM | POA: Diagnosis not present

## 2016-11-05 LAB — URINALYSIS, COMPLETE
Bilirubin, UA: NEGATIVE
Glucose, UA: NEGATIVE
Ketones, UA: NEGATIVE
Leukocytes, UA: NEGATIVE
NITRITE UA: NEGATIVE
PH UA: 7.5 (ref 5.0–7.5)
Protein, UA: NEGATIVE
RBC, UA: NEGATIVE
Specific Gravity, UA: 1 — ABNORMAL LOW (ref 1.005–1.030)
UUROB: 0.2 mg/dL (ref 0.2–1.0)

## 2016-11-05 LAB — MICROSCOPIC EXAMINATION
BACTERIA UA: NONE SEEN
Epithelial Cells (non renal): NONE SEEN /hpf (ref 0–10)
RBC MICROSCOPIC, UA: NONE SEEN /HPF (ref 0–?)
RENAL EPITHEL UA: NONE SEEN /HPF
WBC UA: NONE SEEN /HPF (ref 0–?)

## 2016-11-05 MED ORDER — TESTOSTERONE CYPIONATE 200 MG/ML IM SOLN: INTRAMUSCULAR | 5 refills | Status: DC

## 2016-11-05 NOTE — Patient Instructions (Addendum)
Continue current medications. Continue good therapeutic lifestyle changes which include good diet and exercise. Fall precautions discussed with patient. If an FOBT was given today- please return it to our front desk.  **Flu shots are available--- please call and schedule a FLU-CLINIC appointment**  After your visit with [place] today you will receive a survey in the mail or online from [employer] regarding your care with [place]. Please take a moment to fill this out. Your feedback is very important to [place] as you can help [place] better understand your patient needs as well as improve your experience and satisfaction. WE CARE ABOUT YOU!!!   Schedule eye exam as recommended with [facility] eye physicians We will schedule a visit with [facility] dermatology for general checkup specifically to look at the nevus on your right foot. This winter drink plenty of fluids and stay well hydrated Day active physically and watch her diet closely

## 2016-11-05 NOTE — Progress Notes (Signed)
Subjective:    Patient ID: Jacob Morris, male    DOB: [birth year], 40 y.o.   MRN: [id number]  HPI Patient is here today for annual wellness exam and follow up of chronic medical problems which includes testosterone deficiency. He is taking medications regularly.The patient today comes for his yearly visit. He is due to get a chest x-ray and repeat EKG. He has testosterone deficiency and takes 200 mg by injection every 10 days. This is a half of a cc. His family history was reviewed. He is not having any complaints. The patient denies any chest pain or palpitations or shortness of breath. He does have insomnia and does admit to drinking a lot of caffeine. He will try to limit this in the future and not drinking more caffeine after lunch time. He denies any trouble with his stomach including heartburn indigestion nausea vomiting diarrhea blood in the stool or black tarry bowel movements. As long as he takes his testosterone injections every 10 days he does not have fatigue decreased libido and decreased energy. This is maintaining him well. He does admit this time to having had the testosterone injection a day or 2 before he gets the blood work so we will be aware of that when we do the testosterone level back. He admits that his family history has not changed his mom and dad are stable and his sister has no health issues that he is aware of.     Patient Active Problem List   Diagnosis Date Noted  . Testosterone deficiency 03/06/2014  . Hypogonadism male 03/04/2013   Outpatient Encounter Prescriptions as of 11/05/2016  Medication Sig  . B-D 3CC LUER-LOK SYR 22GX1-1/2 22G X 1-1/2" 3 ML MISC USE TO INJECT TESTOSTERONE EVERY TEN DAYS  . SYRINGE-NEEDLE, DISP, 3 ML 21G X 1" 3 ML MISC Use to inject testosterone every 10 days  . testosterone cypionate (DEPOTESTOSTERONE CYPIONATE) 200 MG/ML injection INJECT 0.5 ML INTO THE MUSCLE EVERY 10 DAYS  . Vitamin D, Ergocalciferol, (DRISDOL) 50000 units CAPS  capsule TAKE 1 CAPSULE (50,000 UNITS TOTAL) BY MOUTH EVERY 7 (SEVEN) DAYS.   No facility-administered encounter medications on file as of 11/05/2016.         Review of Systems  Constitutional: Negative.   HENT: Negative.   Eyes: Negative.   Respiratory: Negative.   Cardiovascular: Negative.   Gastrointestinal: Negative.   Endocrine: Negative.   Genitourinary: Negative.   Musculoskeletal: Negative.   Skin: Negative.   Allergic/Immunologic: Negative.   Neurological: Negative.   Hematological: Negative.   Psychiatric/Behavioral: Negative.        Objective:   Physical Exam  Constitutional: He is oriented to person, place, and time. He appears well-developed and well-nourished. No distress.  HENT:  Head: Normocephalic and atraumatic.  Right Ear: External ear normal.  Left Ear: External ear normal.  Mouth/Throat: Oropharynx is clear and moist. No oropharyngeal exudate.  Slight nasal congestion bilaterally  Eyes: Conjunctivae and EOM are normal. Pupils are equal, round, and reactive to light. Right eye exhibits no discharge. Left eye exhibits no discharge. No scleral icterus.  Neck: Normal range of motion. Neck supple. No thyromegaly present.  Cardiovascular: Normal rate, regular rhythm, normal heart sounds and intact distal pulses.  Exam reveals no gallop and no friction rub.   No murmur heard. The heart has a regular rate and rhythm at 60/m  Pulmonary/Chest: Effort normal and breath sounds normal. No respiratory distress. He has no wheezes. He has no rales. He exhibits no  tenderness.  Clear anteriorly and posteriorly and no axillary adenopathy and no chest wall tenderness  Abdominal: Soft. Bowel sounds are normal. He exhibits no mass. There is no tenderness. There is no rebound and no guarding.  No epigastric or suprapubic tenderness. No liver or spleen enlargement. No masses.  Genitourinary: Rectum normal and penis normal.  Genitourinary Comments: The prostate is only  minimally enlarged. It is smooth to palpation. There are no rectal masses. There are no inguinal hernias present bilaterally. The external genitalia are within normal limits.  Musculoskeletal: Normal range of motion. He exhibits no edema.  Lymphadenopathy:    He has no cervical adenopathy.  Neurological: He is alert and oriented to person, place, and time. He has normal reflexes. No cranial nerve deficit.  Skin: Skin is warm and dry. No rash noted.  Psychiatric: He has a normal mood and affect. His behavior is normal. Judgment and thought content normal.  Nursing note and vitals reviewed.  BP 126/78 (BP Location: Left Arm)   Pulse (!) 51   Temp 97.6 F (36.4 C) (Oral)   Ht 6' 1.5" (1.867 m)   Wt 194 lb (88 kg)   BMI 25.25 kg/m   Chest x-ray with results pending EKG with results pending      Assessment & Plan:  1. Annual physical exam -We will arrange for a visit with [facility] dermatology for a general skin checkup but specifically also to look at the nevus on the right medial foot. -The patient will call and make an appointment to see the ophthalmologist's with [facility] eye physicians in [place] with Differential/Platelet - BMP8+EGFR - Urinalysis, Complete - Lipid panel - Testosterone,Free and Total - PSA, total and free - VITAMIN D 25 Hydroxy (Vit-D Deficiency, Fractures) - Hepatic function panel - DG Chest 2 View; Future - EKG 12-Lead  2. Testosterone deficiency -Isac Caddy current treatment regimen pending results of lab work remembering that the patient recently had his testosterone injection just prior to the blood work. - CBC with Differential/Platelet - BMP8+EGFR - Urinalysis, Complete - Testosterone,Free and Total - PSA, total and free - EKG 12-Lead  3. Vitamin D deficiency -Continue current treatment pending results of lab work - CBC with Differential/Platelet - VITAMIN D 25 Hydroxy (Vit-D Deficiency, Fractures)  4. Benign prostatic  hyperplasia, unspecified whether lower urinary tract symptoms present -The prostate is mildly enlarged but without lumps or masses. He is having no symptoms. - CBC with Differential/Platelet - Urinalysis, Complete - Testosterone,Free and Total - PSA, total and free  5. Hypogonadism male -Continue current treatment pending results of lab work - CBC with Differential/Platelet  6. Benign prostatic hyperplasia without lower urinary tract symptoms -The patient is having no symptoms with this.  Meds ordered this encounter  Medications  . testosterone cypionate (DEPOTESTOSTERONE CYPIONATE) 200 MG/ML injection    Sig: INJECT 0.5 ML INTO THE MUSCLE EVERY 10 DAYS    Dispense:  10 mL    Refill:  5    This request is for a new prescription for a controlled substance as required by Federal/State law.   Patient Instructions  Continue current medications. Continue good therapeutic lifestyle changes which include good diet and exercise. Fall precautions discussed with patient. If an FOBT was given today- please return it to our front desk.  **Flu shots are available--- please call and schedule a FLU-CLINIC appointment**  After your visit with [place] today you will receive a survey in the mail or online from [employer] regarding  your care with [place]. Please take a moment to fill this out. Your feedback is very important to [place] as you can help [place] better understand your patient needs as well as improve your experience and satisfaction. WE CARE ABOUT YOU!!!   Schedule eye exam as recommended with [facility] eye physicians We will schedule a visit with [facility] dermatology for general checkup specifically to look at the nevus on your right foot. This winter drink plenty of fluids and stay well hydrated Day active physically and watch her diet closely   Arrie Senate MD

## 2016-11-05 NOTE — Addendum Note (Signed)
Addended by: Zannie Cove on: 11/05/2016 12:49 PM   Modules accepted: Orders

## 2016-11-06 LAB — HEPATIC FUNCTION PANEL
ALT: 17 IU/L (ref 0–44)
AST: 18 IU/L (ref 0–40)
Albumin: 4.7 g/dL (ref 3.5–5.5)
Alkaline Phosphatase: 46 IU/L (ref 39–117)
BILIRUBIN, DIRECT: 0.2 mg/dL (ref 0.00–0.40)
Bilirubin Total: 0.7 mg/dL (ref 0.0–1.2)
TOTAL PROTEIN: 7.5 g/dL (ref 6.0–8.5)

## 2016-11-06 LAB — BMP8+EGFR
BUN/Creatinine Ratio: 9 (ref 9–20)
BUN: 8 mg/dL (ref 6–24)
CALCIUM: 9.7 mg/dL (ref 8.7–10.2)
CO2: 27 mmol/L (ref 18–29)
CREATININE: 0.87 mg/dL (ref 0.76–1.27)
Chloride: 95 mmol/L — ABNORMAL LOW (ref 96–106)
GFR calc Af Amer: 124 mL/min/{1.73_m2} (ref >59)
GFR calc non Af Amer: 107 mL/min/{1.73_m2} (ref >59)
GLUCOSE: 85 mg/dL (ref 65–99)
Potassium: 5.5 mmol/L — ABNORMAL HIGH (ref 3.5–5.2)
Sodium: 138 mmol/L (ref 134–144)

## 2016-11-06 LAB — CBC WITH DIFFERENTIAL/PLATELET
BASOS ABS: 0 10*3/uL (ref 0.0–0.2)
Basos: 0 %
EOS (ABSOLUTE): 0.2 10*3/uL (ref 0.0–0.4)
Eos: 3 %
HEMOGLOBIN: 16.1 g/dL (ref 12.6–17.7)
Hematocrit: 46 % (ref 37.5–51.0)
IMMATURE GRANS (ABS): 0 10*3/uL (ref 0.0–0.1)
Immature Granulocytes: 0 %
LYMPHS: 36 %
Lymphocytes Absolute: 1.9 10*3/uL (ref 0.7–3.1)
MCH: 30.3 pg (ref 26.6–33.0)
MCHC: 35 g/dL (ref 31.5–35.7)
MCV: 87 fL (ref 79–97)
MONOCYTES: 8 %
Monocytes Absolute: 0.4 10*3/uL (ref 0.1–0.9)
NEUTROS ABS: 2.8 10*3/uL (ref 1.4–7.0)
Neutrophils: 53 %
Platelets: 247 10*3/uL (ref 150–379)
RBC: 5.32 x10E6/uL (ref 4.14–5.80)
RDW: 14.1 % (ref 12.3–15.4)
WBC: 5.4 10*3/uL (ref 3.4–10.8)

## 2016-11-06 LAB — PSA, TOTAL AND FREE
PSA, Free Pct: 46 %
PSA, Free: 0.23 ng/mL
Prostate Specific Ag, Serum: 0.5 ng/mL (ref 0.0–4.0)

## 2016-11-06 LAB — LIPID PANEL
CHOLESTEROL TOTAL: 181 mg/dL (ref 100–199)
Chol/HDL Ratio: 3.5 ratio units (ref 0.0–5.0)
HDL: 51 mg/dL (ref >39)
LDL CALC: 110 mg/dL — AB (ref 0–99)
TRIGLYCERIDES: 101 mg/dL (ref 0–149)
VLDL CHOLESTEROL CAL: 20 mg/dL (ref 5–40)

## 2016-11-06 LAB — TESTOSTERONE,FREE AND TOTAL
Testosterone, Free: 8.5 pg/mL (ref 6.8–21.5)
Testosterone: 351 ng/dL (ref 264–916)

## 2016-11-06 LAB — VITAMIN D 25 HYDROXY (VIT D DEFICIENCY, FRACTURES): VIT D 25 HYDROXY: 37.9 ng/mL (ref 30.0–100.0)

## 2016-11-07 ENCOUNTER — Ambulatory Visit (INDEPENDENT_AMBULATORY_CARE_PROVIDER_SITE_OTHER): Payer: BLUE CROSS/BLUE SHIELD

## 2016-11-07 ENCOUNTER — Other Ambulatory Visit: Payer: Self-pay | Admitting: Family Medicine

## 2016-11-07 ENCOUNTER — Encounter (INDEPENDENT_AMBULATORY_CARE_PROVIDER_SITE_OTHER): Payer: BLUE CROSS/BLUE SHIELD

## 2016-11-07 ENCOUNTER — Other Ambulatory Visit: Payer: BLUE CROSS/BLUE SHIELD

## 2016-11-07 DIAGNOSIS — E875 Hyperkalemia: Secondary | ICD-10-CM | POA: Diagnosis not present

## 2016-11-07 DIAGNOSIS — R222 Localized swelling, mass and lump, trunk: Secondary | ICD-10-CM

## 2016-11-07 NOTE — Addendum Note (Signed)
Addended by: Thana Ates on: 11/07/2016 11:52 AM   Modules accepted: Orders

## 2016-11-08 LAB — BMP8+EGFR
BUN/Creatinine Ratio: 14 (ref 9–20)
BUN: 12 mg/dL (ref 6–24)
CALCIUM: 10.1 mg/dL (ref 8.7–10.2)
CHLORIDE: 98 mmol/L (ref 96–106)
CO2: 26 mmol/L (ref 18–29)
Creatinine, Ser: 0.86 mg/dL (ref 0.76–1.27)
GFR calc Af Amer: 125 mL/min/{1.73_m2} (ref >59)
GFR calc non Af Amer: 108 mL/min/{1.73_m2} (ref >59)
Glucose: 96 mg/dL (ref 65–99)
POTASSIUM: 4.6 mmol/L (ref 3.5–5.2)
Sodium: 141 mmol/L (ref 134–144)

## 2016-11-10 DIAGNOSIS — L432 Lichenoid drug reaction: Secondary | ICD-10-CM | POA: Diagnosis not present

## 2016-11-10 DIAGNOSIS — D1800 Hemangioma unspecified site: Secondary | ICD-10-CM | POA: Diagnosis not present

## 2016-11-10 DIAGNOSIS — D2262 Melanocytic nevi of left upper limb, including shoulder: Secondary | ICD-10-CM | POA: Diagnosis not present

## 2016-11-10 DIAGNOSIS — D2271 Melanocytic nevi of right lower limb, including hip: Secondary | ICD-10-CM | POA: Diagnosis not present

## 2016-11-10 DIAGNOSIS — D485 Neoplasm of uncertain behavior of skin: Secondary | ICD-10-CM | POA: Diagnosis not present

## 2017-02-05 ENCOUNTER — Other Ambulatory Visit: Payer: Self-pay | Admitting: Family Medicine

## 2017-02-20 ENCOUNTER — Telehealth: Payer: Self-pay | Admitting: Family Medicine

## 2017-02-20 MED ORDER — SCOPOLAMINE 1 MG/3DAYS TD PT72: 1.0000 | MEDICATED_PATCH | TRANSDERMAL | 0 refills | Status: DC

## 2017-02-20 NOTE — Telephone Encounter (Signed)
Transderm-Scop 1 patch every 72 hours #2

## 2017-02-20 NOTE — Telephone Encounter (Signed)
rx sent to pharmacy. Patient aware.

## 2017-05-01 ENCOUNTER — Other Ambulatory Visit: Payer: BLUE CROSS/BLUE SHIELD

## 2017-05-01 DIAGNOSIS — N400 Enlarged prostate without lower urinary tract symptoms: Secondary | ICD-10-CM

## 2017-05-01 DIAGNOSIS — E349 Endocrine disorder, unspecified: Secondary | ICD-10-CM

## 2017-05-01 DIAGNOSIS — E559 Vitamin D deficiency, unspecified: Secondary | ICD-10-CM

## 2017-05-01 DIAGNOSIS — Z0000 Encounter for general adult medical examination without abnormal findings: Secondary | ICD-10-CM

## 2017-05-02 LAB — LIPID PANEL
CHOL/HDL RATIO: 3.8 ratio (ref 0.0–5.0)
Cholesterol, Total: 166 mg/dL (ref 100–199)
HDL: 44 mg/dL (ref >39)
LDL Calculated: 101 mg/dL — ABNORMAL HIGH (ref 0–99)
Triglycerides: 106 mg/dL (ref 0–149)
VLDL CHOLESTEROL CAL: 21 mg/dL (ref 5–40)

## 2017-05-02 LAB — CBC WITH DIFFERENTIAL/PLATELET
BASOS ABS: 0 10*3/uL (ref 0.0–0.2)
Basos: 0 %
EOS (ABSOLUTE): 0.2 10*3/uL (ref 0.0–0.4)
Eos: 3 %
HEMATOCRIT: 45.7 % (ref 37.5–51.0)
Hemoglobin: 15.6 g/dL (ref 13.0–17.7)
Immature Grans (Abs): 0 10*3/uL (ref 0.0–0.1)
Immature Granulocytes: 0 %
LYMPHS ABS: 2 10*3/uL (ref 0.7–3.1)
Lymphs: 37 %
MCH: 29.9 pg (ref 26.6–33.0)
MCHC: 34.1 g/dL (ref 31.5–35.7)
MCV: 88 fL (ref 79–97)
MONOS ABS: 0.3 10*3/uL (ref 0.1–0.9)
Monocytes: 6 %
NEUTROS ABS: 2.8 10*3/uL (ref 1.4–7.0)
Neutrophils: 54 %
Platelets: 257 10*3/uL (ref 150–379)
RBC: 5.21 x10E6/uL (ref 4.14–5.80)
RDW: 14.2 % (ref 12.3–15.4)
WBC: 5.3 10*3/uL (ref 3.4–10.8)

## 2017-05-02 LAB — HEPATIC FUNCTION PANEL
ALBUMIN: 4.4 g/dL (ref 3.5–5.5)
ALT: 17 IU/L (ref 0–44)
AST: 22 IU/L (ref 0–40)
Alkaline Phosphatase: 44 IU/L (ref 39–117)
BILIRUBIN TOTAL: 0.4 mg/dL (ref 0.0–1.2)
BILIRUBIN, DIRECT: 0.15 mg/dL (ref 0.00–0.40)
Total Protein: 7.2 g/dL (ref 6.0–8.5)

## 2017-05-02 LAB — BMP8+EGFR
BUN / CREAT RATIO: 9 (ref 9–20)
BUN: 9 mg/dL (ref 6–24)
CO2: 28 mmol/L (ref 18–29)
Calcium: 9.6 mg/dL (ref 8.7–10.2)
Chloride: 95 mmol/L — ABNORMAL LOW (ref 96–106)
Creatinine, Ser: 1 mg/dL (ref 0.76–1.27)
GFR calc Af Amer: 108 mL/min/{1.73_m2} (ref >59)
GFR, EST NON AFRICAN AMERICAN: 93 mL/min/{1.73_m2} (ref >59)
Glucose: 100 mg/dL — ABNORMAL HIGH (ref 65–99)
POTASSIUM: 4.6 mmol/L (ref 3.5–5.2)
SODIUM: 138 mmol/L (ref 134–144)

## 2017-05-02 LAB — PSA, TOTAL AND FREE
PSA FREE: 0.36 ng/mL
PSA, Free Pct: 72 %
Prostate Specific Ag, Serum: 0.5 ng/mL (ref 0.0–4.0)

## 2017-05-02 LAB — TESTOSTERONE,FREE AND TOTAL
TESTOSTERONE FREE: 16.1 pg/mL (ref 6.8–21.5)
Testosterone: 602 ng/dL (ref 264–916)

## 2017-05-02 LAB — VITAMIN D 25 HYDROXY (VIT D DEFICIENCY, FRACTURES): VIT D 25 HYDROXY: 33.4 ng/mL (ref 30.0–100.0)

## 2017-05-07 ENCOUNTER — Encounter: Payer: Self-pay | Admitting: Family Medicine

## 2017-05-07 ENCOUNTER — Ambulatory Visit (INDEPENDENT_AMBULATORY_CARE_PROVIDER_SITE_OTHER): Payer: BLUE CROSS/BLUE SHIELD | Admitting: Family Medicine

## 2017-05-07 ENCOUNTER — Ambulatory Visit (INDEPENDENT_AMBULATORY_CARE_PROVIDER_SITE_OTHER): Payer: BLUE CROSS/BLUE SHIELD

## 2017-05-07 VITALS — BP 148/75 | HR 64 | Temp 98.8°F | Ht 73.5 in | Wt 200.0 lb

## 2017-05-07 DIAGNOSIS — N400 Enlarged prostate without lower urinary tract symptoms: Secondary | ICD-10-CM | POA: Diagnosis not present

## 2017-05-07 DIAGNOSIS — M79674 Pain in right toe(s): Secondary | ICD-10-CM

## 2017-05-07 DIAGNOSIS — E559 Vitamin D deficiency, unspecified: Secondary | ICD-10-CM | POA: Diagnosis not present

## 2017-05-07 DIAGNOSIS — J301 Allergic rhinitis due to pollen: Secondary | ICD-10-CM | POA: Diagnosis not present

## 2017-05-07 DIAGNOSIS — E349 Endocrine disorder, unspecified: Secondary | ICD-10-CM | POA: Diagnosis not present

## 2017-05-07 DIAGNOSIS — Z0000 Encounter for general adult medical examination without abnormal findings: Secondary | ICD-10-CM

## 2017-05-07 LAB — MICROSCOPIC EXAMINATION
BACTERIA UA: NONE SEEN
EPITHELIAL CELLS (NON RENAL): NONE SEEN /HPF (ref 0–10)
RBC MICROSCOPIC, UA: NONE SEEN /HPF (ref 0–?)
Renal Epithel, UA: NONE SEEN /hpf
WBC UA: NONE SEEN /HPF (ref 0–?)

## 2017-05-07 LAB — URINALYSIS, COMPLETE
BILIRUBIN UA: NEGATIVE
Glucose, UA: NEGATIVE
Ketones, UA: NEGATIVE
LEUKOCYTES UA: NEGATIVE
NITRITE UA: NEGATIVE
PROTEIN UA: NEGATIVE
RBC, UA: NEGATIVE
SPEC GRAV UA: 1.01 (ref 1.005–1.030)
Urobilinogen, Ur: 0.2 mg/dL (ref 0.2–1.0)
pH, UA: 7.5 (ref 5.0–7.5)

## 2017-05-07 MED ORDER — FLUTICASONE PROPIONATE 50 MCG/ACT NA SUSP: 2.0000 | Freq: Every day | NASAL | 6 refills | Status: DC

## 2017-05-07 MED ORDER — SCOPOLAMINE 1 MG/3DAYS TD PT72: 1.0000 | MEDICATED_PATCH | TRANSDERMAL | 0 refills | Status: DC

## 2017-05-07 MED ORDER — TESTOSTERONE CYPIONATE 200 MG/ML IM SOLN: INTRAMUSCULAR | 5 refills | Status: DC

## 2017-05-07 NOTE — Progress Notes (Signed)
Subjective:    Patient ID: Jacob Morris, male    DOB: [birth year], 41 y.o.   MRN: [id number]  HPI Pt here for follow up and management of chronic medical problems which includes testosterone deficiency. He is taking medication regularly.The patient comes in today for his regular 6 month checkup because of taking testosterone replacement. He gets one half of a cc of testosterone by injection every 10 days and his wife administers this at home. He does complain today of some right great toe pain and numbness at times. He is had lab work done and this will be reviewed with him during the visit today. All of his liver function tests were normal. The CBC had a normal white blood cell count a good hemoglobin at 15.6 and an adequate platelet count. No abnormal blood cells were present. The blood sugar was minimally elevated at 100 and the creatinine remains within normal limits at 1.00. The electrolytes including potassium are good with a slightly decreased chloride which is been the case in the past. The serum calcium was good. The PSA remains low and stable at 0.5. The testosterone numbers this time were improved with a total testosterone of 602 and a free direct testosterone at 16.1 and both of these were in the normal limits area vitamin D level was at the low end of the normal range at 33.4. Cholesterol numbers with traditional lipid testing have a total LDL C cholesterol that was slightly elevated at 101 triglycerides are good at 106 and the HDL was good at 44. The patient is doing well overall today. He does complain of some right great toe and foot pain. This is been going on for a long time especially the numbness on the lateral aspect of the toe. There is no history of any injury other than he has a long history of running sometimes several miles daily. He does not describe any swelling as far as fever or rubor. He is doing well as far as his testosterone replacement and has plenty of energy and no  problems with his sex life. He denies any chest pain. He did have a couple of bad coals and the flu this winter and he is has some residual cough and congestion with this and thinks it may be allergy related. He is not having any trouble with his stomach including nausea vomiting diarrhea or blood in the stool. There is no family history of colon cancer. He is currently passing his water well without problems. He is due to get an eye exam and we'll readdress this and hopefully get an appointment soon.     Patient Active Problem List   Diagnosis Date Noted  . Testosterone deficiency 03/06/2014  . Hypogonadism male 03/04/2013   Outpatient Encounter Prescriptions as of 05/07/2017  Medication Sig  . B-D 3CC LUER-LOK SYR 22GX1-1/2 22G X 1-1/2" 3 ML MISC USE TO INJECT TESTOSTERONE EVERY TEN DAYS  . SYRINGE-NEEDLE, DISP, 3 ML 21G X 1" 3 ML MISC Use to inject testosterone every 10 days  . testosterone cypionate (DEPOTESTOSTERONE CYPIONATE) 200 MG/ML injection INJECT 0.5 ML INTO THE MUSCLE EVERY 10 DAYS  . Vitamin D, Ergocalciferol, (DRISDOL) 50000 units CAPS capsule TAKE 1 CAPSULE (50,000 UNITS TOTAL) BY MOUTH EVERY 7 (SEVEN) DAYS.  . [DISCONTINUED] scopolamine (TRANSDERM-SCOP, 1.5 MG,) 1 MG/3DAYS Place 1 patch (1.5 mg total) onto the skin every 3 (three) days.   No facility-administered encounter medications on file as of 05/07/2017.      Review of  Systems  Constitutional: Negative.   HENT: Negative.   Eyes: Negative.   Respiratory: Negative.   Cardiovascular: Negative.   Gastrointestinal: Negative.   Endocrine: Negative.   Genitourinary: Negative.   Musculoskeletal: Negative.        Right great toe numbness  Skin: Negative.   Allergic/Immunologic: Negative.   Neurological: Negative.   Hematological: Negative.   Psychiatric/Behavioral: Negative.        Objective:   Physical Exam  Constitutional: He is oriented to person, place, and time. He appears well-developed and well-nourished.  No distress.  HENT:  Head: Normocephalic and atraumatic.  Right Ear: External ear normal.  Left Ear: External ear normal.  Mouth/Throat: Oropharynx is clear and moist. No oropharyngeal exudate.  Slight nasal congestion and turbinate swelling bilaterally  Eyes: Conjunctivae and EOM are normal. Pupils are equal, round, and reactive to light. Right eye exhibits no discharge. Left eye exhibits no discharge. No scleral icterus.  Patient is to get eye exam soon  Neck: Normal range of motion. Neck supple. No thyromegaly present.  Cardiovascular: Normal rate, regular rhythm, normal heart sounds and intact distal pulses.   No murmur heard. Heart is 60/m with a regular rate and rhythm  Pulmonary/Chest: Effort normal and breath sounds normal. No respiratory distress. He has no wheezes. He has no rales. He exhibits no tenderness.  No axillary adenopathy and breath sounds are good anteriorly and posteriorly  Abdominal: Soft. Bowel sounds are normal. He exhibits no mass. There is no tenderness. There is no rebound and no guarding.  No liver or spleen enlargement. No masses. No tenderness. No bruits. No inguinal adenopathy.  Genitourinary: Rectum normal, prostate normal and penis normal.  Genitourinary Comments: The prostate is slightly enlarged but smooth without lumps or masses and no rectal masses. External genitalia were within normal limits with no inguinal hernias being palpated.  Musculoskeletal: Normal range of motion. He exhibits no edema or tenderness.  There was slight bunion formation bilaterally in both great toes but no swelling or edema. There is no point tenderness.  Lymphadenopathy:    He has no cervical adenopathy.  Neurological: He is alert and oriented to person, place, and time. He has normal reflexes. No cranial nerve deficit.  Skin: Skin is warm and dry. No rash noted.  Psychiatric: He has a normal mood and affect. His behavior is normal. Judgment and thought content normal.    Nursing note and vitals reviewed.  BP 127/75 (BP Location: Left Arm)   Pulse 60   Temp 98.8 F (37.1 C) (Oral)   Ht 6' 1.5" (1.867 m)   Wt 200 lb (90.7 kg)   BMI 26.03 kg/m         Assessment & Plan:  1. Testosterone deficiency -Testosterone levels were within normal limits and patient is doing well with testosterone replacement at currently 100 mg every 10 days by injection. - Urinalysis, Complete  2. Vitamin D deficiency -The vitamin D level was at the low end of the normal range but the patient will continue to take his 50,000 units once weekly.  3. Benign prostatic hyperplasia, unspecified whether lower urinary tract symptoms present - Urinalysis, Complete  4. Health care maintenance -Lab work was reviewed with patient and he understands the importance of watching his diet closely and keeping his blood sugar under control by watching his carbs and watching his weight.  5. Great toe pain, right -This is most likely arthritic in nature it because of his history of running. There is a  slight bunion formation on each great toe. -We encouraged to buy shoes that have plenty of wet and plenty of depth. - DG Toe Great Right; Future  6. Seasonal allergic rhinitis due to pollen -Flonase 1 spray each nostril at bedtime  Meds ordered this encounter  Medications  . testosterone cypionate (DEPOTESTOSTERONE CYPIONATE) 200 MG/ML injection    Sig: INJECT 0.5 ML INTO THE MUSCLE EVERY 10 DAYS    Dispense:  10 mL    Refill:  5    This request is for a new prescription for a controlled substance as required by Federal/State law.  . scopolamine (TRANSDERM-SCOP, 1.5 MG,) 1 MG/3DAYS    Sig: Place 1 patch (1.5 mg total) onto the skin every 3 (three) days.    Dispense:  3 patch    Refill:  0   Patient Instructions  Continue current medications. Continue good therapeutic lifestyle changes which include good diet and exercise. Fall precautions discussed with patient. If an FOBT was  given today- please return it to our front desk. If you are over 61 years old - you may need Prevnar 84 or the adult Pneumonia vaccine.  **Flu shots are available--- please call and schedule a FLU-CLINIC appointment**  After your visit with [place] today you will receive a survey in the mail or online from [employer] regarding your care with [place]. Please take a moment to fill this out. Your feedback is very important to [place] as you can help [place] better understand your patient needs as well as improve your experience and satisfaction. WE CARE ABOUT YOU!!!   We will call with the x-ray results as soon as they are returned Continue to work with diet and drink plenty of fluids and stay well hydrated We will ask you to try Flonase 1 spray each nostril at bedtime to see if this helps some of your cough and congestion and allergy type symptoms. Use Mucinex maximum strength plain 1 twice daily if needed for cough and congestion with a large glass of water Omron blood pressure monitor is the best monitor for monitoring blood pressures  Arrie Senate MD

## 2017-05-07 NOTE — Addendum Note (Signed)
Addended by: Zannie Cove on: 05/07/2017 09:13 AM   Modules accepted: Orders

## 2017-05-07 NOTE — Patient Instructions (Addendum)
Continue current medications. Continue good therapeutic lifestyle changes which include good diet and exercise. Fall precautions discussed with patient. If an FOBT was given today- please return it to our front desk. If you are over 42 years old - you may need Prevnar 12 or the adult Pneumonia vaccine.  **Flu shots are available--- please call and schedule a FLU-CLINIC appointment**  After your visit with [place] today you will receive a survey in the mail or online from [employer] regarding your care with [place]. Please take a moment to fill this out. Your feedback is very important to [place] as you can help [place] better understand your patient needs as well as improve your experience and satisfaction. WE CARE ABOUT YOU!!!   We will call with the x-ray results as soon as they are returned Continue to work with diet and drink plenty of fluids and stay well hydrated We will ask you to try Flonase 1 spray each nostril at bedtime to see if this helps some of your cough and congestion and allergy type symptoms. Use Mucinex maximum strength plain 1 twice daily if needed for cough and congestion with a large glass of water Omron blood pressure monitor is the best monitor for monitoring blood pressures

## 2017-05-08 ENCOUNTER — Other Ambulatory Visit: Payer: BLUE CROSS/BLUE SHIELD

## 2017-05-08 DIAGNOSIS — Z1211 Encounter for screening for malignant neoplasm of colon: Secondary | ICD-10-CM

## 2017-05-09 LAB — URIC ACID: Uric Acid: 8.5 mg/dL (ref 3.7–8.6)

## 2017-05-09 LAB — SPECIMEN STATUS REPORT

## 2017-05-10 LAB — FECAL OCCULT BLOOD, IMMUNOCHEMICAL: Fecal Occult Bld: NEGATIVE

## 2017-06-16 ENCOUNTER — Ambulatory Visit (INDEPENDENT_AMBULATORY_CARE_PROVIDER_SITE_OTHER): Payer: BLUE CROSS/BLUE SHIELD | Admitting: Family

## 2017-06-16 ENCOUNTER — Encounter: Payer: Self-pay | Admitting: Family

## 2017-06-16 VITALS — BP 134/87 | HR 65 | Temp 98.9°F | Ht 73.5 in | Wt 199.0 lb

## 2017-06-16 DIAGNOSIS — W5501XA Bitten by cat, initial encounter: Secondary | ICD-10-CM

## 2017-06-16 DIAGNOSIS — S60470A Other superficial bite of right index finger, initial encounter: Secondary | ICD-10-CM

## 2017-06-16 MED ORDER — AMOXICILLIN-POT CLAVULANATE 875-125 MG PO TABS: 1.0000 | ORAL_TABLET | Freq: Two times a day (BID) | ORAL | 0 refills | Status: DC

## 2017-06-16 NOTE — Patient Instructions (Signed)
Animal Bite Animal bites can range from mild to serious. An animal bite can result in a scratch on the skin, a deep open cut, a puncture of the skin, a crush injury, or tearing away of the skin or a body part. A small bite from a house pet will usually not cause serious problems. However, some animal bites can become infected or injure a bone or other tissue. Bites from certain animals can be more dangerous because of the risk of spreading rabies, which is a serious viral infection. This risk is higher with bites from stray animals or wild animals, such as raccoons, foxes, skunks, and bats. Dogs are responsible for most animal bites. Children are bitten more often than adults. What are the signs or symptoms? Common symptoms of an animal bite include:  Pain.  Bleeding.  Swelling.  Bruising.  How is this diagnosed? This condition may be diagnosed based on a physical exam and medical history. Your health care provider will examine the wound and ask for details about the animal and how the bite happened. You may also have tests, such as:  Blood tests to check for infection or to determine if surgery is needed.  X-rays to check for damage to bones or joints.  Culture test. This uses a sample of fluid from the wound to check for infection.  How is this treated? Treatment varies depending on the location and type of animal bite and your medical history. Treatment may include:  Wound care. This often includes cleaning the wound, flushing the wound with saline solution, and applying a bandage (dressing). Sometimes, the wound is left open to heal because of the high risk of infection. However, in some cases, the wound may be closed with stitches (sutures), staples, skin glue, or adhesive strips.  Antibiotic medicine.  Tetanus shot.  Rabies treatment if the animal could have rabies.  In some cases, bites that have become infected may require IV antibiotics and surgical treatment in the  hospital. Follow these instructions at home: Wound care  Follow instructions from your health care provider about how to take care of your wound. Make sure you: ? Wash your hands with soap and water before you change your dressing. If soap and water are not available, use hand sanitizer. ? Change your dressing as told by your health care provider. ? Leave sutures, skin glue, or adhesive strips in place. These skin closures may need to be in place for 2 weeks or longer. If adhesive strip edges start to loosen and curl up, you may trim the loose edges. Do not remove adhesive strips completely unless your health care provider tells you to do that.  Check your wound every day for signs of infection. Watch for: ? Increasing redness, swelling, or pain. ? Fluid, blood, or pus. General instructions  Take or apply over-the-counter and prescription medicines only as told by your health care provider.  If you were prescribed an antibiotic, take or apply it as told by your health care provider. Do not stop using the antibiotic even if your condition improves.  Keep the injured area raised (elevated) above the level of your heart while you are sitting or lying down, if this is possible.  If directed, apply ice to the injured area. ? Put ice in a plastic bag. ? Place a towel between your skin and the bag. ? Leave the ice on for 20 minutes, 2-3 times per day.  Keep all follow-up visits as told by your health care   provider. This is important. Contact a health care provider if:  You have increasing redness, swelling, or pain at the site of your wound.  You have a general feeling of sickness (malaise).  You feel nauseous or you vomit.  You have pain that does not get better. Get help right away if:  You have a red streak extending away from your wound.  You have fluid, blood, or pus coming from your wound.  You have a fever or chills.  You have trouble moving your injured area.  You have  numbness or tingling extending beyond the wound. This information is not intended to replace advice given to you by your health care provider. Make sure you discuss any questions you have with your health care provider. Document Released: 08/12/2011 Document Revised: 04/02/2016 Document Reviewed: 04/11/2015 Elsevier Interactive Patient Education  2018 [employer]  

## 2017-06-16 NOTE — Progress Notes (Signed)
   Subjective:    Patient ID: Jacob Morris, male    DOB: 1976/04/26, 41 y.o.   MRN: [id number]  PT presents to the office today for a cat bite to right index finger that occurred today.  Animal Bite   The incident occurred today. The incident occurred at home. Head/neck injury location: right index finger. There is an injury to the right index finger. The pain is moderate. It is unlikely that a foreign body is present. Pertinent negatives include no numbness, no tingling and no weakness. His tetanus status is UTD (2014).      Review of Systems  Skin:       Small bite on right index finger  Neurological: Negative for tingling, weakness and numbness.  All other systems reviewed and are negative.      Objective:   Physical Exam  Constitutional: He is oriented to person, place, and time. He appears well-developed and well-nourished. No distress.  HENT:  Head: Normocephalic.  Eyes: Pupils are equal, round, and reactive to light. Right eye exhibits no discharge. Left eye exhibits no discharge.  Neck: Normal range of motion. Neck supple. No thyromegaly present.  Cardiovascular: Normal rate, regular rhythm, normal heart sounds and intact distal pulses.   No murmur heard. Pulmonary/Chest: Effort normal and breath sounds normal. No respiratory distress. He has no wheezes.  Musculoskeletal: Normal range of motion. He exhibits no edema or tenderness.  Neurological: He is alert and oriented to person, place, and time.  Skin: Skin is warm and dry. No rash noted. No erythema.  Small puncture wound on right index middle joint, no erythemas or discharge present. Trace swelling present   Psychiatric: He has a normal mood and affect. His behavior is normal. Judgment and thought content normal.  Vitals reviewed.     BP 134/87   Pulse 65   Temp 98.9 F (37.2 C) (Oral)   Ht 6' 1.5" (1.867 m)   Wt 199 lb (90.3 kg)   BMI 25.90 kg/m      Assessment & Plan:  1. Cat bite, initial  encounter Keep clean and dry Report any increased erythemas, discharge, or fevers Start augmentin today Pt's TDAP UTD and cat is up to date on all vaccines  RTO prn  - amoxicillin-clavulanate (AUGMENTIN) 875-125 MG tablet; Take 1 tablet by mouth 2 (two) times daily.  Dispense: 14 tablet; Refill: 0   Evelina Dun, FNP

## 2017-10-28 ENCOUNTER — Other Ambulatory Visit: Payer: BLUE CROSS/BLUE SHIELD

## 2017-10-28 DIAGNOSIS — E349 Endocrine disorder, unspecified: Secondary | ICD-10-CM | POA: Diagnosis not present

## 2017-10-28 DIAGNOSIS — Z0000 Encounter for general adult medical examination without abnormal findings: Secondary | ICD-10-CM | POA: Diagnosis not present

## 2017-10-29 LAB — BMP8+EGFR
BUN/Creatinine Ratio: 8 — ABNORMAL LOW (ref 9–20)
BUN: 8 mg/dL (ref 6–24)
CALCIUM: 9.4 mg/dL (ref 8.7–10.2)
CHLORIDE: 101 mmol/L (ref 96–106)
CO2: 24 mmol/L (ref 20–29)
CREATININE: 1.05 mg/dL (ref 0.76–1.27)
GFR calc Af Amer: 101 mL/min/{1.73_m2} (ref >59)
GFR calc non Af Amer: 87 mL/min/{1.73_m2} (ref >59)
GLUCOSE: 95 mg/dL (ref 65–99)
Potassium: 4.3 mmol/L (ref 3.5–5.2)
Sodium: 140 mmol/L (ref 134–144)

## 2017-10-29 LAB — CBC WITH DIFFERENTIAL/PLATELET
BASOS ABS: 0 10*3/uL (ref 0.0–0.2)
Basos: 0 %
EOS (ABSOLUTE): 0.2 10*3/uL (ref 0.0–0.4)
Eos: 4 %
Hematocrit: 48.4 % (ref 37.5–51.0)
Hemoglobin: 16.1 g/dL (ref 13.0–17.7)
IMMATURE GRANS (ABS): 0 10*3/uL (ref 0.0–0.1)
IMMATURE GRANULOCYTES: 0 %
LYMPHS: 21 %
Lymphocytes Absolute: 1.4 10*3/uL (ref 0.7–3.1)
MCH: 29.9 pg (ref 26.6–33.0)
MCHC: 33.3 g/dL (ref 31.5–35.7)
MCV: 90 fL (ref 79–97)
MONOCYTES: 9 %
Monocytes Absolute: 0.6 10*3/uL (ref 0.1–0.9)
NEUTROS ABS: 4.5 10*3/uL (ref 1.4–7.0)
NEUTROS PCT: 66 %
PLATELETS: 206 10*3/uL (ref 150–379)
RBC: 5.39 x10E6/uL (ref 4.14–5.80)
RDW: 14 % (ref 12.3–15.4)
WBC: 6.8 10*3/uL (ref 3.4–10.8)

## 2017-10-29 LAB — HEPATIC FUNCTION PANEL
ALK PHOS: 41 IU/L (ref 39–117)
ALT: 13 IU/L (ref 0–44)
AST: 22 IU/L (ref 0–40)
Albumin: 4.5 g/dL (ref 3.5–5.5)
BILIRUBIN, DIRECT: 0.19 mg/dL (ref 0.00–0.40)
Bilirubin Total: 0.6 mg/dL (ref 0.0–1.2)
TOTAL PROTEIN: 6.9 g/dL (ref 6.0–8.5)

## 2017-10-29 LAB — LIPID PANEL
CHOLESTEROL TOTAL: 156 mg/dL (ref 100–199)
Chol/HDL Ratio: 3.3 ratio (ref 0.0–5.0)
HDL: 48 mg/dL (ref >39)
LDL Calculated: 93 mg/dL (ref 0–99)
TRIGLYCERIDES: 74 mg/dL (ref 0–149)
VLDL CHOLESTEROL CAL: 15 mg/dL (ref 5–40)

## 2017-11-01 LAB — TESTOSTERONE,FREE AND TOTAL
TESTOSTERONE FREE: 15.1 pg/mL (ref 6.8–21.5)
TESTOSTERONE: 548 ng/dL (ref 264–916)

## 2017-11-01 LAB — SPECIMEN STATUS REPORT

## 2017-11-06 ENCOUNTER — Ambulatory Visit (INDEPENDENT_AMBULATORY_CARE_PROVIDER_SITE_OTHER): Payer: BLUE CROSS/BLUE SHIELD | Admitting: Family Medicine

## 2017-11-06 ENCOUNTER — Encounter: Payer: Self-pay | Admitting: Family Medicine

## 2017-11-06 VITALS — BP 140/76 | HR 65 | Temp 97.2°F | Ht 73.5 in | Wt 198.0 lb

## 2017-11-06 DIAGNOSIS — E349 Endocrine disorder, unspecified: Secondary | ICD-10-CM

## 2017-11-06 DIAGNOSIS — Z0000 Encounter for general adult medical examination without abnormal findings: Secondary | ICD-10-CM | POA: Diagnosis not present

## 2017-11-06 DIAGNOSIS — E559 Vitamin D deficiency, unspecified: Secondary | ICD-10-CM

## 2017-11-06 DIAGNOSIS — J301 Allergic rhinitis due to pollen: Secondary | ICD-10-CM

## 2017-11-06 DIAGNOSIS — N400 Enlarged prostate without lower urinary tract symptoms: Secondary | ICD-10-CM

## 2017-11-06 DIAGNOSIS — E291 Testicular hypofunction: Secondary | ICD-10-CM

## 2017-11-06 LAB — MICROSCOPIC EXAMINATION
Bacteria, UA: NONE SEEN
EPITHELIAL CELLS (NON RENAL): NONE SEEN /HPF (ref 0–10)
RBC, UA: NONE SEEN /hpf (ref 0–?)
RENAL EPITHEL UA: NONE SEEN /HPF
WBC, UA: NONE SEEN /hpf (ref 0–?)

## 2017-11-06 LAB — URINALYSIS, COMPLETE
Bilirubin, UA: NEGATIVE
Glucose, UA: NEGATIVE
Ketones, UA: NEGATIVE
LEUKOCYTES UA: NEGATIVE
Nitrite, UA: NEGATIVE
PH UA: 7.5 (ref 5.0–7.5)
Protein, UA: NEGATIVE
RBC, UA: NEGATIVE
SPEC GRAV UA: 1.015 (ref 1.005–1.030)
Urobilinogen, Ur: 0.2 mg/dL (ref 0.2–1.0)

## 2017-11-06 NOTE — Patient Instructions (Addendum)
Continue current medications. Continue good therapeutic lifestyle changes which include good diet and exercise. Fall precautions discussed with patient. If an FOBT was given today- please return it to our front desk. If you are over 42 years old - you may need Prevnar 37 or the adult Pneumonia vaccine.  **Flu shots are available--- please call and schedule a FLU-CLINIC appointment**  After your visit with [place] today you will receive a survey in the mail or online from [employer] regarding your care with [place]. Please take a moment to fill this out. Your feedback is very important to [place] as you can help [place] better understand your patient needs as well as improve your experience and satisfaction. WE CARE ABOUT YOU!!!   Try some Flonase over-the-counter 1 or 2 sprays at bedtime each nostril and continue to use nasal saline during the day Drink plenty of fluids and stay well-hydrated Continue with current testosterone treatment Continue with current exercise regimen and diet as all numbers look great We will call with the results of the vitamin D thyroid profile and PSA as soon as they become available

## 2017-11-06 NOTE — Progress Notes (Signed)
Subjective:    Patient ID: Jacob Morris, male    DOB: 08-May-1976, 41 y.o.   MRN: [id number]  HPI Patient is here today for annual wellness exam and follow up of chronic medical problems which includes testosterone deficiency. He is taking medication regularly.  The patient is doing well overall and has no specific complaints.  He is on testosterone replacement by injection and takes weekly injections.  His wife administers this at home.  He has been well controlled with his testosterone levels being within normal limits and his PSA staying down.  We do not have a PSA result with this blood draw but will get one today.  His lab work will be reviewed with him during the visit today.  His blood sugar was good at 95.  The creatinine was good and within normal limits on all of the electrolytes including potassium are good.  CBC had a normal white blood cell count with a good hemoglobin at 16.1 and an adequate platelet count.  Wynetta Emery all numbers with traditional lipid testing have a total cholesterol that was good at 156 triglycerides good at 74 and LDL see cholesterol good and at goal at 93.  All liver function tests were within normal limits.  The total testosterone was good at 548 free direct testosterone was within normal limits at 15.1.  A PSA and thyroid profile will be drawn today to make this blood work complete and he will be called with those results when they return.  The family history is positive and that his mother has asthma and hypertension.  His father has had a cerebral aneurysm at both of his parents are living.  The patient had a chest x-ray last year and an EKG and both were good.  The patient denies any chest pain or shortness of breath.  He exercises regularly.  He has been watching his diet closely.  He denies any trouble with swallowing heartburn indigestion nausea vomiting diarrhea change in bowel habits blood in the stool or black tarry bowel movements.  He is passing his water without  problems.  The testosterone replacement has His fatigue under control and his sex life is good.  He denies any trouble passing his water including burning pain or frequency.    Patient Active Problem List   Diagnosis Date Noted  . Testosterone deficiency 03/06/2014  . Hypogonadism male 03/04/2013   Outpatient Encounter Medications as of 11/06/2017  Medication Sig  . B-D 3CC LUER-LOK SYR 22GX1-1/2 22G X 1-1/2" 3 ML MISC USE TO INJECT TESTOSTERONE EVERY TEN DAYS  . SYRINGE-NEEDLE, DISP, 3 ML 21G X 1" 3 ML MISC Use to inject testosterone every 10 days  . testosterone cypionate (DEPOTESTOSTERONE CYPIONATE) 200 MG/ML injection INJECT 0.5 ML INTO THE MUSCLE EVERY 10 DAYS  . [DISCONTINUED] amoxicillin-clavulanate (AUGMENTIN) 875-125 MG tablet Take 1 tablet by mouth 2 (two) times daily.  . [DISCONTINUED] scopolamine (TRANSDERM-SCOP, 1.5 MG,) 1 MG/3DAYS Place 1 patch (1.5 mg total) onto the skin every 3 (three) days. (Patient not taking: Reported on 06/16/2017)  . [DISCONTINUED] Vitamin D, Ergocalciferol, (DRISDOL) 50000 units CAPS capsule TAKE 1 CAPSULE (50,000 UNITS TOTAL) BY MOUTH EVERY 7 (SEVEN) DAYS.   No facility-administered encounter medications on file as of 11/06/2017.       Review of Systems  Constitutional: Negative.   HENT: Negative.   Eyes: Negative.   Respiratory: Negative.   Cardiovascular: Negative.   Gastrointestinal: Negative.   Endocrine: Negative.   Genitourinary: Negative.   Musculoskeletal: Negative.  Skin: Negative.   Allergic/Immunologic: Negative.   Neurological: Negative.   Hematological: Negative.   Psychiatric/Behavioral: Negative.        Objective:   Physical Exam  Constitutional: He is oriented to person, place, and time. He appears well-developed and well-nourished. No distress.  Patient is pleasant and alert  HENT:  Head: Normocephalic and atraumatic.  Right Ear: External ear normal.  Left Ear: External ear normal.  Mouth/Throat: Oropharynx is  clear and moist. No oropharyngeal exudate.  Turbinate congestion bilaterally  Eyes: Conjunctivae and EOM are normal. Pupils are equal, round, and reactive to light. Right eye exhibits no discharge. Left eye exhibits no discharge. No scleral icterus.  Recent eye exam at my eye doctor and a low prescription for the left eye was given  Neck: Normal range of motion. Neck supple. No thyromegaly present.  No bruits thyromegaly or anterior cervical adenopathy  Cardiovascular: Normal rate, regular rhythm, normal heart sounds and intact distal pulses.  No murmur heard. Pulmonary/Chest: Effort normal and breath sounds normal. No respiratory distress. He has no wheezes. He has no rales. He exhibits no tenderness.  Clear anteriorly and posteriorly with no axillary adenopathy or chest wall masses  Abdominal: Soft. Bowel sounds are normal. He exhibits no mass. There is no tenderness. There is no rebound and no guarding.  The abdomen is soft without masses tenderness or organ enlargement bruits or inguinal adenopathy  Genitourinary: Rectum normal and penis normal.  Genitourinary Comments: Prostate is slightly enlarged but soft and smooth with no lumps or masses and no rectal masses.  The external genitalia were within normal limits and.  Musculoskeletal: Normal range of motion. He exhibits no edema.  Lymphadenopathy:    He has no cervical adenopathy.  Neurological: He is alert and oriented to person, place, and time. He has normal reflexes. No cranial nerve deficit.  Skin: Skin is warm and dry. No rash noted.  Psychiatric: He has a normal mood and affect. His behavior is normal. Judgment and thought content normal.  Nursing note and vitals reviewed.  BP 140/76 (BP Location: Left Arm)   Pulse 65   Temp (!) 97.2 F (36.2 C) (Oral)   Ht 6' 1.5" (1.867 m)   Wt 198 lb (89.8 kg)   BMI 25.77 kg/m         Assessment & Plan:  1. Testosterone deficiency -All testosterone numbers were great and the  patient will continue with his every 10-day injection he is feeling well with this regimen - PSA, total and free - Urinalysis, Complete  2. Vitamin D deficiency -Vitamin D level today and may need to restart this if it is low again. - VITAMIN D 25 Hydroxy (Vit-D Deficiency, Fractures)  3. Benign prostatic hyperplasia, unspecified whether lower urinary tract symptoms present -The patient is doing well with this with no symptoms.  4. Annual physical exam -Patient had a chest x-ray EKG last year.  There is no family history of colon cancer.  He has FOBT is negative from June.  We will probably plan on getting a stress test on him when he turns 44 and may be a colonoscopy at the same time. - PSA, total and free - Thyroid Panel With TSH - Urinalysis, Complete - VITAMIN D 25 Hydroxy (Vit-D Deficiency, Fractures)  5. Hypogonadism male -Continue with current testosterone replacement  6. Seasonal allergic rhinitis due to pollen -Encouraged the patient to try some Flonase over-the-counter 1 or 2 sprays each nostril at bedtime and continue with nasal saline  frequently during the day  .No orders of the defined types were placed in this encounter.  Patient Instructions  Continue current medications. Continue good therapeutic lifestyle changes which include good diet and exercise. Fall precautions discussed with patient. If an FOBT was given today- please return it to our front desk. If you are over 4 years old - you may need Prevnar 87 or the adult Pneumonia vaccine.  **Flu shots are available--- please call and schedule a FLU-CLINIC appointment**  After your visit with [place] today you will receive a survey in the mail or online from [employer] regarding your care with [place]. Please take a moment to fill this out. Your feedback is very important to [place] as you can help [place] better understand your patient needs as well as improve your experience and satisfaction. WE CARE ABOUT YOU!!!   Try some  Flonase over-the-counter 1 or 2 sprays at bedtime each nostril and continue to use nasal saline during the day Drink plenty of fluids and stay well-hydrated Continue with current testosterone treatment Continue with current exercise regimen and diet as all numbers look great We will call with the results of the vitamin D thyroid profile and PSA as soon as they become available  Arrie Senate MD

## 2017-11-07 LAB — PSA, TOTAL AND FREE
PROSTATE SPECIFIC AG, SERUM: 0.5 ng/mL (ref 0.0–4.0)
PSA FREE PCT: 64 %
PSA FREE: 0.32 ng/mL

## 2017-11-07 LAB — THYROID PANEL WITH TSH
Free Thyroxine Index: 1.5 (ref 1.2–4.9)
T3 UPTAKE RATIO: 28 % (ref 24–39)
T4, Total: 5.4 ug/dL (ref 4.5–12.0)
TSH: 1.88 u[IU]/mL (ref 0.450–4.500)

## 2017-11-07 LAB — VITAMIN D 25 HYDROXY (VIT D DEFICIENCY, FRACTURES): Vit D, 25-Hydroxy: 29.3 ng/mL — ABNORMAL LOW (ref 30.0–100.0)

## 2017-11-09 ENCOUNTER — Other Ambulatory Visit: Payer: Self-pay | Admitting: *Deleted

## 2017-11-09 MED ORDER — VITAMIN D (ERGOCALCIFEROL) 1.25 MG (50000 UNIT) PO CAPS
50000.0000 [IU] | ORAL_CAPSULE | ORAL | 3 refills | Status: DC
Start: 2017-11-09 — End: 2017-11-10

## 2017-11-10 ENCOUNTER — Other Ambulatory Visit: Payer: Self-pay | Admitting: Family Medicine

## 2017-11-10 DIAGNOSIS — D485 Neoplasm of uncertain behavior of skin: Secondary | ICD-10-CM | POA: Diagnosis not present

## 2017-11-10 DIAGNOSIS — L219 Seborrheic dermatitis, unspecified: Secondary | ICD-10-CM | POA: Diagnosis not present

## 2017-11-10 MED ORDER — VITAMIN D (ERGOCALCIFEROL) 1.25 MG (50000 UNIT) PO CAPS
50000.0000 [IU] | ORAL_CAPSULE | ORAL | 3 refills | Status: DC
Start: 2017-11-10 — End: 2018-12-16

## 2017-11-10 MED ORDER — TESTOSTERONE CYPIONATE 200 MG/ML IM SOLN: INTRAMUSCULAR | 5 refills | Status: DC

## 2017-11-10 NOTE — Telephone Encounter (Signed)
Done pt aware °

## 2018-05-07 ENCOUNTER — Encounter: Payer: Self-pay | Admitting: Family Medicine

## 2018-05-07 ENCOUNTER — Ambulatory Visit (INDEPENDENT_AMBULATORY_CARE_PROVIDER_SITE_OTHER): Payer: BLUE CROSS/BLUE SHIELD | Admitting: Family Medicine

## 2018-05-07 VITALS — BP 134/80 | HR 58 | Temp 97.5°F | Ht 73.5 in | Wt 199.4 lb

## 2018-05-07 DIAGNOSIS — E559 Vitamin D deficiency, unspecified: Secondary | ICD-10-CM

## 2018-05-07 DIAGNOSIS — Z0000 Encounter for general adult medical examination without abnormal findings: Secondary | ICD-10-CM

## 2018-05-07 DIAGNOSIS — E349 Endocrine disorder, unspecified: Secondary | ICD-10-CM | POA: Diagnosis not present

## 2018-05-07 LAB — URINALYSIS, COMPLETE
BILIRUBIN UA: NEGATIVE
GLUCOSE, UA: NEGATIVE
KETONES UA: NEGATIVE
Leukocytes, UA: NEGATIVE
Nitrite, UA: NEGATIVE
Protein, UA: NEGATIVE
RBC UA: NEGATIVE
SPEC GRAV UA: 1.015 (ref 1.005–1.030)
UUROB: 0.2 mg/dL (ref 0.2–1.0)
pH, UA: 7.5 (ref 5.0–7.5)

## 2018-05-07 LAB — MICROSCOPIC EXAMINATION
Bacteria, UA: NONE SEEN
Epithelial Cells (non renal): NONE SEEN /hpf (ref 0–10)
RBC, UA: NONE SEEN /hpf (ref 0–2)
Renal Epithel, UA: NONE SEEN /hpf
WBC UA: NONE SEEN /HPF (ref 0–5)

## 2018-05-07 MED ORDER — TESTOSTERONE CYPIONATE 200 MG/ML IM SOLN: INTRAMUSCULAR | 5 refills | Status: DC

## 2018-05-07 NOTE — Patient Instructions (Signed)
Continue with testosterone injections 100 mg half of a cc of 200 mg/cc every 10 days is currently doing Stay active physically and drink plenty of fluids to stay well-hydrated We will call with lab work results as soon as these results become available

## 2018-05-07 NOTE — Progress Notes (Signed)
Subjective:    Patient ID: Jacob Morris, male    DOB: 01-21-1976, 42 y.o.   MRN: [id number]  HPI   Patient is here today for a follow up oh testosterone deficiency and vitamin d deficiency. Patient has no other complaints today.  Patient continues to do well.  He is young and healthy and has had the testosterone deficiency for years and his wife gives him testosterone injections weekly at home.  He needs a refill on this as well as his vitamin D.  He will get a rectal exam and genitalia exam today as we do every 6 months for him.  He will also get lab work and a urinalysis.  He has no other complaints.  Both of his parents are still living.  His mother has asthma and high blood pressure and his father has had a brain aneurysm.  He has a sister that is alive and well.  He is currently taking vitamin D 50,000 units weekly along with getting his 200 mg per meal injection of testosterone.  He is 42 years old and there is no family history of colon polyps or colon cancer.  He will not be due for a routine colonoscopy until he turns 11.   Review of Systems  Constitutional: Negative.   HENT: Negative.   Eyes: Negative.   Respiratory: Negative.   Cardiovascular: Negative.   Gastrointestinal: Negative.   Endocrine: Negative.   Genitourinary: Negative.   Musculoskeletal: Negative.   Skin: Negative.   Allergic/Immunologic: Negative.   Neurological: Negative.   Hematological: Negative.   Psychiatric/Behavioral: Negative.        Objective:   Physical Exam  Constitutional: He is oriented to person, place, and time. He appears well-developed and well-nourished. No distress.  Patient is pleasant and alert  HENT:  Head: Normocephalic and atraumatic.  Right Ear: External ear normal.  Left Ear: External ear normal.  Mouth/Throat: Oropharynx is clear and moist. No oropharyngeal exudate.  Nasal turbinate congestion left greater than right  Eyes: Pupils are equal, round, and reactive to  light. Conjunctivae and EOM are normal. Right eye exhibits no discharge. Left eye exhibits no discharge.  Neck: Normal range of motion. Neck supple. No thyromegaly present.  No bruits thyromegaly or anterior cervical adenopathy  Cardiovascular: Normal rate, regular rhythm, normal heart sounds and intact distal pulses.  No murmur heard. Heart is regular at 60/min  Pulmonary/Chest: Effort normal and breath sounds normal. He has no wheezes. He has no rales. He exhibits no tenderness.  No chest wall tenderness masses or axillary adenopathy  Abdominal: Soft. Bowel sounds are normal. He exhibits no mass. There is no tenderness.  No abdominal tenderness masses organ enlargement or bruits  Genitourinary: Rectum normal and penis normal.  Genitourinary Comments: Prostate is minimally enlarged soft and smooth without any lumps or masses.  No testicular masses no rectal masses no inguinal hernias  Musculoskeletal: Normal range of motion. He exhibits no edema.  Lymphadenopathy:    He has no cervical adenopathy.  Neurological: He is alert and oriented to person, place, and time. He has normal reflexes. No cranial nerve deficit.  Skin: Skin is warm and dry. No rash noted.  Psychiatric: He has a normal mood and affect. His behavior is normal. Judgment and thought content normal.  Patient's mood affect and behavior were normal.  Nursing note and vitals reviewed.   BP 134/80   Pulse (!) 58   Temp (!) 97.5 F (36.4 C) (Oral)   Ht  6' 1.5" (1.867 m)   Wt 199 lb 6.4 oz (90.4 kg)   BMI 25.95 kg/m        Assessment & Plan:  1. Testosterone deficiency -Continue current treatment of 100 mg or 1/2 cc of a 200 mg/cc injection every 10 days given by wife at home - PSA, total and free - Testosterone,Free and Total - Urinalysis, Complete  2. Vitamin D deficiency -Continue with vitamin D replacement pending results of lab work - VITAMIN D 25 Hydroxy (Vit-D Deficiency, Fractures)  3. Health care  maintenance -Continue aggressive therapeutic lifestyle changes - Hepatic function panel - BMP8+EGFR - Lipid panel  Meds ordered this encounter  Medications  . testosterone cypionate (DEPOTESTOSTERONE CYPIONATE) 200 MG/ML injection    Sig: INJECT 0.5 ML INTO THE MUSCLE EVERY 10 DAYS    Dispense:  10 mL    Refill:  5    This request is for a new prescription for a controlled substance as required by Federal/State law.   Patient Instructions  Continue with testosterone injections 100 mg half of a cc of 200 mg/cc every 10 days is currently doing Stay active physically and drink plenty of fluids to stay well-hydrated We will call with lab work results as soon as these results become available  Arrie Senate MD

## 2018-05-08 LAB — SPECIMEN STATUS

## 2018-05-12 LAB — BMP8+EGFR
BUN / CREAT RATIO: 12 (ref 9–20)
BUN: 14 mg/dL (ref 6–24)
CALCIUM: 9.6 mg/dL (ref 8.7–10.2)
CO2: 24 mmol/L (ref 20–29)
CREATININE: 1.18 mg/dL (ref 0.76–1.27)
Chloride: 99 mmol/L (ref 96–106)
GFR calc Af Amer: 87 mL/min/{1.73_m2} (ref >59)
GFR calc non Af Amer: 76 mL/min/{1.73_m2} (ref >59)
Glucose: 101 mg/dL — ABNORMAL HIGH (ref 65–99)
Potassium: 4.5 mmol/L (ref 3.5–5.2)
Sodium: 138 mmol/L (ref 134–144)

## 2018-05-12 LAB — TESTOSTERONE,FREE AND TOTAL
TESTOSTERONE FREE: 13.5 pg/mL (ref 6.8–21.5)
TESTOSTERONE: 666 ng/dL (ref 264–916)

## 2018-05-12 LAB — CBC WITH DIFFERENTIAL/PLATELET
BASOS: 0 %
Basophils Absolute: 0 10*3/uL (ref 0.0–0.2)
EOS (ABSOLUTE): 0.2 10*3/uL (ref 0.0–0.4)
EOS: 4 %
HEMATOCRIT: 46.4 % (ref 37.5–51.0)
Hemoglobin: 15.6 g/dL (ref 13.0–17.7)
IMMATURE GRANS (ABS): 0 10*3/uL (ref 0.0–0.1)
IMMATURE GRANULOCYTES: 0 %
LYMPHS: 39 %
Lymphocytes Absolute: 2 10*3/uL (ref 0.7–3.1)
MCH: 29.9 pg (ref 26.6–33.0)
MCHC: 33.6 g/dL (ref 31.5–35.7)
MCV: 89 fL (ref 79–97)
MONOCYTES: 8 %
MONOS ABS: 0.4 10*3/uL (ref 0.1–0.9)
NEUTROS PCT: 49 %
Neutrophils Absolute: 2.5 10*3/uL (ref 1.4–7.0)
Platelets: 242 10*3/uL (ref 150–450)
RBC: 5.22 x10E6/uL (ref 4.14–5.80)
RDW: 13.9 % (ref 12.3–15.4)
WBC: 5.2 10*3/uL (ref 3.4–10.8)

## 2018-05-12 LAB — VITAMIN D 25 HYDROXY (VIT D DEFICIENCY, FRACTURES): Vit D, 25-Hydroxy: 39.3 ng/mL (ref 30.0–100.0)

## 2018-05-12 LAB — SPECIMEN STATUS REPORT

## 2018-05-12 LAB — PSA, TOTAL AND FREE
PSA, Free Pct: 72 %
PSA, Free: 0.36 ng/mL
Prostate Specific Ag, Serum: 0.5 ng/mL (ref 0.0–4.0)

## 2018-05-12 LAB — HEPATIC FUNCTION PANEL
ALT: 17 IU/L (ref 0–44)
AST: 30 IU/L (ref 0–40)
Albumin: 4.5 g/dL (ref 3.5–5.5)
Alkaline Phosphatase: 41 IU/L (ref 39–117)
BILIRUBIN TOTAL: 0.6 mg/dL (ref 0.0–1.2)
BILIRUBIN, DIRECT: 0.17 mg/dL (ref 0.00–0.40)
TOTAL PROTEIN: 6.9 g/dL (ref 6.0–8.5)

## 2018-05-12 LAB — LIPID PANEL
Chol/HDL Ratio: 3.3 ratio (ref 0.0–5.0)
Cholesterol, Total: 171 mg/dL (ref 100–199)
HDL: 52 mg/dL (ref >39)
LDL CALC: 105 mg/dL — AB (ref 0–99)
Triglycerides: 71 mg/dL (ref 0–149)
VLDL CHOLESTEROL CAL: 14 mg/dL (ref 5–40)

## 2018-05-25 ENCOUNTER — Other Ambulatory Visit: Payer: Self-pay | Admitting: Family Medicine

## 2018-05-25 MED ORDER — "SYRINGE 25G X 1"" 3 ML MISC": 0 refills | Status: DC

## 2018-05-25 NOTE — Telephone Encounter (Signed)
What is the name of the medication? Needles for testosterone injection #25 gauge X 1 inch. [facility] has been out of them and wants to send RX to [facility]  Have you contacted your pharmacy to request a refill? NO  Which pharmacy would you like this sent to? [facility]   Patient notified that their request is being sent to the clinical staff for review and that they should receive a call once it is complete. If they do not receive a call within 24 hours they can check with their pharmacy or our office.

## 2018-10-22 ENCOUNTER — Other Ambulatory Visit: Payer: Self-pay | Admitting: *Deleted

## 2018-10-22 MED ORDER — "SYRINGE 25G X 1"" 3 ML MISC": 11 refills | Status: DC

## 2018-10-25 ENCOUNTER — Ambulatory Visit: Payer: BLUE CROSS/BLUE SHIELD | Admitting: Family Medicine

## 2018-11-08 ENCOUNTER — Ambulatory Visit: Payer: BLUE CROSS/BLUE SHIELD | Admitting: Family Medicine

## 2018-11-10 DIAGNOSIS — D235 Other benign neoplasm of skin of trunk: Secondary | ICD-10-CM | POA: Diagnosis not present

## 2018-11-10 DIAGNOSIS — L570 Actinic keratosis: Secondary | ICD-10-CM | POA: Diagnosis not present

## 2018-11-10 DIAGNOSIS — L309 Dermatitis, unspecified: Secondary | ICD-10-CM | POA: Diagnosis not present

## 2018-12-16 ENCOUNTER — Ambulatory Visit (INDEPENDENT_AMBULATORY_CARE_PROVIDER_SITE_OTHER): Payer: BLUE CROSS/BLUE SHIELD

## 2018-12-16 ENCOUNTER — Encounter: Payer: Self-pay | Admitting: Family Medicine

## 2018-12-16 ENCOUNTER — Ambulatory Visit (INDEPENDENT_AMBULATORY_CARE_PROVIDER_SITE_OTHER): Payer: BLUE CROSS/BLUE SHIELD | Admitting: Family Medicine

## 2018-12-16 VITALS — BP 132/82 | HR 57 | Temp 97.1°F | Ht 73.5 in | Wt 207.0 lb

## 2018-12-16 DIAGNOSIS — E349 Endocrine disorder, unspecified: Secondary | ICD-10-CM | POA: Diagnosis not present

## 2018-12-16 DIAGNOSIS — E559 Vitamin D deficiency, unspecified: Secondary | ICD-10-CM | POA: Diagnosis not present

## 2018-12-16 DIAGNOSIS — N400 Enlarged prostate without lower urinary tract symptoms: Secondary | ICD-10-CM | POA: Diagnosis not present

## 2018-12-16 DIAGNOSIS — Z0000 Encounter for general adult medical examination without abnormal findings: Secondary | ICD-10-CM | POA: Diagnosis not present

## 2018-12-16 DIAGNOSIS — Z8249 Family history of ischemic heart disease and other diseases of the circulatory system: Secondary | ICD-10-CM

## 2018-12-16 DIAGNOSIS — Z0001 Encounter for general adult medical examination with abnormal findings: Secondary | ICD-10-CM | POA: Diagnosis not present

## 2018-12-16 LAB — MICROSCOPIC EXAMINATION
Bacteria, UA: NONE SEEN
Epithelial Cells (non renal): NONE SEEN /hpf (ref 0–10)
RBC, UA: NONE SEEN /hpf (ref 0–2)
Renal Epithel, UA: NONE SEEN /hpf
WBC, UA: NONE SEEN /hpf (ref 0–5)

## 2018-12-16 LAB — URINALYSIS, COMPLETE
Bilirubin, UA: NEGATIVE
Glucose, UA: NEGATIVE
Ketones, UA: NEGATIVE
Leukocytes, UA: NEGATIVE
Nitrite, UA: NEGATIVE
PROTEIN UA: NEGATIVE
RBC, UA: NEGATIVE
Specific Gravity, UA: 1.01 (ref 1.005–1.030)
Urobilinogen, Ur: 0.2 mg/dL (ref 0.2–1.0)
pH, UA: 7.5 (ref 5.0–7.5)

## 2018-12-16 MED ORDER — VITAMIN D (ERGOCALCIFEROL) 1.25 MG (50000 UNIT) PO CAPS: 50000.0000 [IU] | ORAL_CAPSULE | ORAL | 3 refills | Status: DC

## 2018-12-16 MED ORDER — "SYRINGE 25G X 1"" 3 ML MISC": 3 refills | Status: DC

## 2018-12-16 MED ORDER — "SYRINGE 25G X 1"" 3 ML MISC": 2 refills | Status: DC

## 2018-12-16 MED ORDER — TESTOSTERONE CYPIONATE 200 MG/ML IM SOLN: INTRAMUSCULAR | 3 refills | Status: DC

## 2018-12-16 NOTE — Patient Instructions (Addendum)
Continue current medications. Continue good therapeutic lifestyle changes which include good diet and exercise. Fall precautions discussed with patient. If an FOBT was given today- please return it to our front desk.  **Flu shots are available--- please call and schedule a FLU-CLINIC appointment**  After your visit with [place] today you will receive a survey in the mail or online from [employer] regarding your care with [place]. Please take a moment to fill this out. Your feedback is very important to [place] as you can help [place] better understand your patient needs as well as improve your experience and satisfaction. WE CARE ABOUT YOU!!!   Continue with testosterone injections unless any changes are necessary based on lab work that is going to be done. We will arrange for you to have a cardiac evaluation at some point in the future just for family history of hypertension. Any changes in family health please get back in touch Any worsening in swallowing issues, please get back in touch with [place] and let us know Continue with therapeutic lifestyle changes including diet and exercise since the holiday season is over with.

## 2018-12-16 NOTE — Progress Notes (Signed)
Subjective:    Patient ID: Jacob Morris, male    DOB: Dec 08, 1976, 43 y.o.   MRN: [id number]  HPI Patient is here today for annual wellness exam and follow up of chronic medical problems which includes decreased testosterone.  Jacob Morris comes in today for his yearly physical exam which will include his prostate exam that is done every 6 months.  He will be given an FOBT to return he will get a chest x-ray today will come back for fasting lab work.  He will get a urinalysis today and an EKG today.  He is 43 years old and he has no family history of colon cancer or family history of colon polyps we will defer a colonoscopy until he turns 62.  He will get annual FOBT's check.  His weight is up about 8 pounds since the last visit.  His wife gives his testosterone injections at home.  This is his major problems.  He does have vitamin D deficiency and testosterone deficiency.  On history reviewed, he has had a vasectomy.  Both parents are still living and his mother has hypertension and asthma and his father has had a brain aneurysm.  He has 1 sister who is in good health.  The patient gets his testosterone injections at home every 10 days.  We reviewed his family history and there is been no change with that.  He does notice some trouble with swallowing large pills white vitamins and we will continue to monitor that.  He has no trouble with swallowing foods.  He denies any chest pain pressure tightness or shortness of breath.  He denies any trouble with heartburn indigestion nausea vomiting diarrhea blood in the stool black tarry bowel movements or change in bowel habits.  He is passing his water well his sexual function is good and he denies any increased fatigue or tiredness.    Patient Active Problem List   Diagnosis Date Noted  . Testosterone deficiency 03/06/2014  . Hypogonadism male 03/04/2013   Outpatient Encounter Medications as of 12/16/2018  Medication Sig  . Syringe/Needle, Disp, (SYRINGE  3CC/25GX1") 25G X 1" 3 ML MISC Use to inject testosterone every 10 days  . testosterone cypionate (DEPOTESTOSTERONE CYPIONATE) 200 MG/ML injection INJECT 0.5 ML INTO THE MUSCLE EVERY 10 DAYS  . Vitamin D, Ergocalciferol, (DRISDOL) 50000 units CAPS capsule Take 1 capsule (50,000 Units total) by mouth every 7 (seven) days.   No facility-administered encounter medications on file as of 12/16/2018.      Review of Systems  Constitutional: Negative.   HENT: Negative.   Eyes: Negative.   Respiratory: Negative.   Cardiovascular: Negative.   Gastrointestinal: Negative.   Endocrine: Negative.   Genitourinary: Negative.   Musculoskeletal: Negative.   Skin: Negative.   Allergic/Immunologic: Negative.   Neurological: Negative.   Hematological: Negative.   Psychiatric/Behavioral: Negative.        Objective:   Physical Exam Vitals signs and nursing note reviewed.  Constitutional:      Appearance: He is well-developed and normal weight. He is not ill-appearing.  HENT:     Head: Normocephalic and atraumatic.     Right Ear: Tympanic membrane, ear canal and external ear normal. There is no impacted cerumen.     Left Ear: Tympanic membrane, ear canal and external ear normal. There is no impacted cerumen.     Nose: Nose normal. No congestion.     Mouth/Throat:     Mouth: Mucous membranes are moist.  Pharynx: Oropharynx is clear. No oropharyngeal exudate.  Eyes:     General: No scleral icterus.       Right eye: No discharge.        Left eye: No discharge.     Extraocular Movements: Extraocular movements intact.     Conjunctiva/sclera: Conjunctivae normal.     Pupils: Pupils are equal, round, and reactive to light.     Comments: Gets eye exams regularly  Neck:     Musculoskeletal: Normal range of motion and neck supple. No muscular tenderness.     Thyroid: No thyromegaly.     Vascular: No carotid bruit.     Trachea: No tracheal deviation.     Comments: No bruits thyromegaly or anterior  cervical adenopathy Cardiovascular:     Rate and Rhythm: Normal rate and regular rhythm.     Pulses: Normal pulses.     Heart sounds: Normal heart sounds. No murmur. No gallop.      Comments: Heart is regular at 60/min good pedal pulses and no edema Pulmonary:     Effort: Pulmonary effort is normal. No respiratory distress.     Breath sounds: Normal breath sounds. No wheezing or rales.     Comments: Clear anteriorly and posteriorly and no axillary adenopathy chest wall masses or tenderness. Chest:     Chest wall: No tenderness.  Abdominal:     General: Abdomen is flat. Bowel sounds are normal.     Palpations: Abdomen is soft. There is no mass.     Tenderness: There is no abdominal tenderness. There is no guarding or rebound.     Comments: No liver or spleen enlargement epigastric or suprapubic tenderness or inguinal adenopathy.  No bruits.  Genitourinary:    Penis: Normal.      Scrotum/Testes: Normal.     Rectum: Normal.     Comments: Prostate is minimally enlarged without lumps masses.  The rectal exam was negative.  The external genitalia was normal and no inguinal hernias were palpable. Musculoskeletal: Normal range of motion.        General: No tenderness.     Right lower leg: No edema.     Left lower leg: No edema.  Lymphadenopathy:     Cervical: No cervical adenopathy.  Skin:    General: Skin is warm and dry.     Findings: No rash.  Neurological:     General: No focal deficit present.     Mental Status: He is alert and oriented to person, place, and time. Mental status is at baseline.     Cranial Nerves: No cranial nerve deficit.     Deep Tendon Reflexes: Reflexes are normal and symmetric.  Psychiatric:        Mood and Affect: Mood normal.        Behavior: Behavior normal.        Thought Content: Thought content normal.        Judgment: Judgment normal.     Comments: Mood affect and behavior are normal for this patient    BP 132/82 (BP Location: Left Arm)   Pulse  (!) 57   Temp (!) 97.1 F (36.2 C) (Oral)   Ht 6' 1.5" (1.867 m)   Wt 207 lb (93.9 kg)   BMI 26.94 kg/m   EKG with results pending=== X-ray with results pending===      Assessment & Plan:  1. Annual physical exam -Patient has never seen a cardiologist and we will arrange for a cardiology visit in  light of his family history of hypertension. - DG Chest 2 View; Future - EKG 12-Lead - BMP8+EGFR - CBC with Differential/Platelet - Lipid panel - VITAMIN D 25 Hydroxy (Vit-D Deficiency, Fractures) - Hepatic function panel - PSA, total and free - Testosterone,Free and Total  2. Benign prostatic hyperplasia, unspecified whether lower urinary tract symptoms present -Patient is having no symptoms or complaints with voiding he is on testosterone injections every 10 days. - CBC with Differential/Platelet - PSA, total and free - Testosterone,Free and Total - Urinalysis, Complete  3. Vitamin D deficiency -Continue with vitamin D replacement pending results of lab work - CBC with Differential/Platelet - VITAMIN D 25 Hydroxy (Vit-D Deficiency, Fractures)  4. Testosterone deficiency -Continue with current testosterone injection planned.  Patient will get a testosterone level tomorrow and this will be fairly close to his recent testosterone injection and we must take that into account when the lab work results are reviewed - CBC with Differential/Platelet - PSA, total and free - Testosterone,Free and Total - Urinalysis, Complete  5. Family history of brain aneurysm -No headaches noted by patient. - Ambulatory referral to Cardiology  6. Family history of hypertension - Ambulatory referral to Cardiology  Meds ordered this encounter  Medications  . DISCONTD: Vitamin D, Ergocalciferol, (DRISDOL) 1.25 MG (50000 UT) CAPS capsule    Sig: Take 1 capsule (50,000 Units total) by mouth every 7 (seven) days.    Dispense:  12 capsule    Refill:  3  . DISCONTD: Syringe/Needle, Disp, (SYRINGE  3CC/25GX1") 25G X 1" 3 ML MISC    Sig: Use to inject testosterone every 10 days    Dispense:  50 each    Refill:  2    E34.9  . testosterone cypionate (DEPOTESTOSTERONE CYPIONATE) 200 MG/ML injection    Sig: INJECT 0.5 ML INTO THE MUSCLE EVERY 10 DAYS    Dispense:  10 mL    Refill:  3    This request is for a new prescription for a controlled substance as required by Federal/State law.  . Syringe/Needle, Disp, (SYRINGE 3CC/25GX1") 25G X 1" 3 ML MISC    Sig: Use to inject testosterone every 10 days    Dispense:  50 each    Refill:  3    E34.9  . Vitamin D, Ergocalciferol, (DRISDOL) 1.25 MG (50000 UT) CAPS capsule    Sig: Take 1 capsule (50,000 Units total) by mouth every 7 (seven) days.    Dispense:  12 capsule    Refill:  3   Patient Instructions  Continue current medications. Continue good therapeutic lifestyle changes which include good diet and exercise. Fall precautions discussed with patient. If an FOBT was given today- please return it to our front desk.  **Flu shots are available--- please call and schedule a FLU-CLINIC appointment**  After your visit with [place] today you will receive a survey in the mail or online from [employer] regarding your care with [place]. Please take a moment to fill this out. Your feedback is very important to [place] as you can help [place] better understand your patient needs as well as improve your experience and satisfaction. WE CARE ABOUT YOU!!!   Continue with testosterone injections unless any changes are necessary based on lab work that is going to be done. We will arrange for you to have a cardiac evaluation at some point in the future just for family history of hypertension. Any changes in family health please get back in touch Any worsening in swallowing issues,  please get back in touch with [place] and let us know Continue with therapeutic lifestyle changes including diet and exercise since the holiday season is over with.  Arrie Senate MD

## 2018-12-17 ENCOUNTER — Other Ambulatory Visit: Payer: BLUE CROSS/BLUE SHIELD

## 2018-12-17 DIAGNOSIS — E349 Endocrine disorder, unspecified: Secondary | ICD-10-CM | POA: Diagnosis not present

## 2018-12-17 DIAGNOSIS — Z1211 Encounter for screening for malignant neoplasm of colon: Secondary | ICD-10-CM | POA: Diagnosis not present

## 2018-12-17 DIAGNOSIS — Z0000 Encounter for general adult medical examination without abnormal findings: Secondary | ICD-10-CM | POA: Diagnosis not present

## 2018-12-17 DIAGNOSIS — E559 Vitamin D deficiency, unspecified: Secondary | ICD-10-CM | POA: Diagnosis not present

## 2018-12-17 DIAGNOSIS — N400 Enlarged prostate without lower urinary tract symptoms: Secondary | ICD-10-CM | POA: Diagnosis not present

## 2018-12-18 LAB — HEPATIC FUNCTION PANEL
ALT: 27 IU/L (ref 0–44)
AST: 23 IU/L (ref 0–40)
Albumin: 4.8 g/dL (ref 3.5–5.5)
Alkaline Phosphatase: 39 IU/L (ref 39–117)
Bilirubin Total: 0.7 mg/dL (ref 0.0–1.2)
Bilirubin, Direct: 0.19 mg/dL (ref 0.00–0.40)
Total Protein: 7.6 g/dL (ref 6.0–8.5)

## 2018-12-18 LAB — LIPID PANEL
CHOLESTEROL TOTAL: 206 mg/dL — AB (ref 100–199)
Chol/HDL Ratio: 3.7 ratio (ref 0.0–5.0)
HDL: 55 mg/dL (ref >39)
LDL Calculated: 138 mg/dL — ABNORMAL HIGH (ref 0–99)
Triglycerides: 65 mg/dL (ref 0–149)
VLDL Cholesterol Cal: 13 mg/dL (ref 5–40)

## 2018-12-18 LAB — BMP8+EGFR
BUN/Creatinine Ratio: 12 (ref 9–20)
BUN: 13 mg/dL (ref 6–24)
CALCIUM: 9.8 mg/dL (ref 8.7–10.2)
CO2: 21 mmol/L (ref 20–29)
CREATININE: 1.09 mg/dL (ref 0.76–1.27)
Chloride: 97 mmol/L (ref 96–106)
GFR calc Af Amer: 96 mL/min/{1.73_m2} (ref >59)
GFR, EST NON AFRICAN AMERICAN: 83 mL/min/{1.73_m2} (ref >59)
Glucose: 99 mg/dL (ref 65–99)
Potassium: 4.7 mmol/L (ref 3.5–5.2)
Sodium: 135 mmol/L (ref 134–144)

## 2018-12-18 LAB — VITAMIN D 25 HYDROXY (VIT D DEFICIENCY, FRACTURES): Vit D, 25-Hydroxy: 22.1 ng/mL — ABNORMAL LOW (ref 30.0–100.0)

## 2018-12-18 LAB — PSA, TOTAL AND FREE
PSA, Free Pct: 62 %
PSA, Free: 0.31 ng/mL
Prostate Specific Ag, Serum: 0.5 ng/mL (ref 0.0–4.0)

## 2018-12-18 LAB — CBC WITH DIFFERENTIAL/PLATELET
BASOS ABS: 0 10*3/uL (ref 0.0–0.2)
BASOS: 1 %
EOS (ABSOLUTE): 0.2 10*3/uL (ref 0.0–0.4)
Eos: 3 %
HEMOGLOBIN: 16 g/dL (ref 13.0–17.7)
Hematocrit: 47.2 % (ref 37.5–51.0)
IMMATURE GRANULOCYTES: 0 %
Immature Grans (Abs): 0 10*3/uL (ref 0.0–0.1)
LYMPHS: 43 %
Lymphocytes Absolute: 2.3 10*3/uL (ref 0.7–3.1)
MCH: 29.1 pg (ref 26.6–33.0)
MCHC: 33.9 g/dL (ref 31.5–35.7)
MCV: 86 fL (ref 79–97)
Monocytes Absolute: 0.5 10*3/uL (ref 0.1–0.9)
Monocytes: 10 %
NEUTROS ABS: 2.3 10*3/uL (ref 1.4–7.0)
NEUTROS PCT: 43 %
PLATELETS: 277 10*3/uL (ref 150–450)
RBC: 5.5 x10E6/uL (ref 4.14–5.80)
RDW: 13.3 % (ref 11.6–15.4)
WBC: 5.3 10*3/uL (ref 3.4–10.8)

## 2018-12-18 LAB — TESTOSTERONE,FREE AND TOTAL
TESTOSTERONE: 994 ng/dL — AB (ref 264–916)
Testosterone, Free: 21.7 pg/mL — ABNORMAL HIGH (ref 6.8–21.5)

## 2018-12-18 LAB — FECAL OCCULT BLOOD, IMMUNOCHEMICAL: Fecal Occult Bld: NEGATIVE

## 2019-01-06 ENCOUNTER — Encounter: Payer: Self-pay | Admitting: Cardiology

## 2019-01-10 ENCOUNTER — Encounter: Payer: Self-pay | Admitting: Family

## 2019-01-10 ENCOUNTER — Ambulatory Visit (INDEPENDENT_AMBULATORY_CARE_PROVIDER_SITE_OTHER): Payer: BLUE CROSS/BLUE SHIELD | Admitting: Family

## 2019-01-10 VITALS — BP 135/84 | HR 72 | Temp 98.5°F | Ht 73.5 in | Wt 206.0 lb

## 2019-01-10 DIAGNOSIS — J101 Influenza due to other identified influenza virus with other respiratory manifestations: Secondary | ICD-10-CM

## 2019-01-10 DIAGNOSIS — R6889 Other general symptoms and signs: Secondary | ICD-10-CM

## 2019-01-10 NOTE — Progress Notes (Signed)
Subjective:    Patient ID: Jacob Morris, male    DOB: 24-Dec-1975, 43 y.o.   MRN: [id number]  Chief Complaint  Patient presents with  . Fever  . Generalized Body Aches  . Headache    congestion  . Cough    Fever   Associated symptoms include coughing, headaches and wheezing. Pertinent negatives include no ear pain.  Headache   Associated symptoms include coughing, a fever and rhinorrhea. Pertinent negatives include no ear pain.  Cough  This is a new problem. The current episode started in the past 7 days. The problem has been gradually improving. The problem occurs every few minutes. The cough is productive of sputum. Associated symptoms include chills, ear congestion, a fever, headaches, myalgias, nasal congestion, postnasal drip, rhinorrhea and wheezing. Pertinent negatives include no ear pain or shortness of breath. The symptoms are aggravated by lying down. He has tried rest and OTC cough suppressant for the symptoms. The treatment provided mild relief. There is no history of asthma or COPD.      Review of Systems  Constitutional: Positive for chills and fever.  HENT: Positive for postnasal drip and rhinorrhea. Negative for ear pain.   Respiratory: Positive for cough and wheezing. Negative for shortness of breath.   Musculoskeletal: Positive for myalgias.  Neurological: Positive for headaches.  All other systems reviewed and are negative.      Objective:   Physical Exam Vitals signs reviewed.  Constitutional:      General: He is not in acute distress.    Appearance: He is well-developed. He is ill-appearing.  HENT:     Head: Normocephalic.     Right Ear: External ear normal.     Left Ear: External ear normal.     Nose: Mucosal edema and rhinorrhea present.     Mouth/Throat:     Pharynx: Pharyngeal swelling and posterior oropharyngeal erythema present.  Eyes:     General:        Right eye: No discharge.        Left eye: No discharge.     Pupils: Pupils  are equal, round, and reactive to light.  Neck:     Musculoskeletal: Normal range of motion and neck supple.     Thyroid: No thyromegaly.  Cardiovascular:     Rate and Rhythm: Normal rate and regular rhythm.     Heart sounds: Normal heart sounds. No murmur.  Pulmonary:     Effort: Pulmonary effort is normal. No respiratory distress.     Breath sounds: Normal breath sounds. No wheezing.     Comments: Intermittent nonproductive cough Abdominal:     General: Bowel sounds are normal. There is no distension.     Palpations: Abdomen is soft.     Tenderness: There is no abdominal tenderness.  Musculoskeletal: Normal range of motion.        General: No tenderness.  Skin:    General: Skin is warm and dry.     Findings: No erythema or rash.  Neurological:     Mental Status: He is alert and oriented to person, place, and time.     Cranial Nerves: No cranial nerve deficit.     Deep Tendon Reflexes: Reflexes are normal and symmetric.  Psychiatric:        Behavior: Behavior normal.        Thought Content: Thought content normal.        Judgment: Judgment normal.       BP 135/84  Pulse 72   Temp 98.5 F (36.9 C) (Oral)   Ht 6' 1.5" (1.867 m)   Wt 206 lb (93.4 kg)   BMI 26.81 kg/m      Assessment & Plan:  Jacob Morris comes in today with chief complaint of Fever; Generalized Body Aches; Headache (congestion); and Cough   Diagnosis and orders addressed:  1. Flu-like symptoms - Veritor Flu A/B Waived  2. Influenza A Rest Force fluids Tylenol or Motrin prn  Droplet precautions discussed  Out of the window for Tamiflu RTO if symptoms worsen or do not improve    Evelina Dun, FNP

## 2019-01-10 NOTE — Patient Instructions (Signed)

## 2019-01-11 LAB — VERITOR FLU A/B WAIVED
Influenza A: POSITIVE — AB
Influenza B: NEGATIVE

## 2019-01-30 NOTE — Progress Notes (Signed)
Cardiology Office Note   Date:  01/31/2019   ID:  Jacob Morris 1976/03/27, MRN [id number]  PCP:  Jacob Herb, MD  Cardiologist:   Jacob Haning [place], MD   Chief Complaint  Patient presents with  . New Patient (Initial Visit)    abnormal ekg , family of heart issures       History of Present Illness: Jacob Morris is a 43 y.o. male who is seen at the request of Dr. Laurance Morris for evaluation of family history of CAD and HTN. 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 He is generally in excellent health. His BP has been borderline but hasn't required medication to date. No history of DM or smoking. He has 4 children and tries to eat healthy and exercises regularly primarily by running. He denies any symptoms of chest pain, fatigue, dyspnea, palpitations, dizziness. Notes several grandparents died of heart disease. Maternal grandfather died at age 39 but may have had Rheumatic heart disease. 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 Other family members died at older age.     Past Medical History:  Diagnosis Date  . Hyperlipidemia   . Low testosterone     Past Surgical History:  Procedure Laterality Date  . VASECTOMY       Current Outpatient Medications  Medication Sig Dispense Refill  . Syringe/Needle, Disp, (SYRINGE 3CC/25GX1") 25G X 1" 3 ML MISC Use to inject testosterone every 10 days 50 each 3  . testosterone cypionate (DEPOTESTOSTERONE CYPIONATE) 200 MG/ML injection INJECT 0.5 ML INTO THE MUSCLE EVERY 10 DAYS 10 mL 3  . Vitamin D, Ergocalciferol, (DRISDOL) 1.25 MG (50000 UT) CAPS capsule Take 1 capsule (50,000 Units total) by mouth every 7 (seven) days. 12 capsule 3   No current facility-administered medications for this visit.     Allergies:   Patient has no known allergies.    Social History:  The patient  reports that he has never smoked. He has never used smokeless tobacco. He reports current alcohol use. He reports  that he does not use drugs.   Family History:  The patient's family history includes Asthma in his mother; Heart attack in his maternal grandmother; Heart disease in his maternal grandfather and maternal grandmother; Hypertension in his mother; Other in his father.    ROS:  Please see the history of present illness.   Otherwise, review of systems are positive for none.   All other systems are reviewed and negative.    PHYSICAL EXAM: VS:  BP (!) 142/90   Pulse 65   Ht 6\' 1"  (1.854 m)   Wt 204 lb 9.6 oz (92.8 kg)   BMI 26.99 kg/m  , BMI Body mass index is 26.99 kg/m. GEN: Well nourished, well developed, in no acute distress  HEENT: normal  Neck: no JVD, carotid bruits, or masses Cardiac: RRR; no murmurs, rubs, or gallops,no edema  Respiratory:  clear to auscultation bilaterally, normal work of breathing GI: soft, nontender, nondistended, + BS MS: no deformity or atrophy  Skin: warm and dry, no rash Neuro:  Strength and sensation are intact Psych: euthymic mood, full affect   EKG:  EKG is ordered today. The ekg ordered today demonstrates NSR with normal Ecg. I have personally reviewed and interpreted this study.    Recent Labs: 12/17/2018: ALT 27; BUN 13; Creatinine, Ser 1.09; Hemoglobin 16.0; Platelets 277; Potassium 4.7; Sodium 135    Lipid Panel    Component  Value Date/Time   CHOL 206 (H) 12/17/2018 0844   TRIG 65 12/17/2018 0844   TRIG 77 03/10/2016 0802   HDL 55 12/17/2018 0844   HDL 43 03/10/2016 0802   CHOLHDL 3.7 12/17/2018 0844   LDLCALC 138 (H) 12/17/2018 0844   LDLCALC 101 (H) 09/11/2014 0809      Wt Readings from Last 3 Encounters:  01/31/19 204 lb 9.6 oz (92.8 kg)  01/10/19 206 lb (93.4 kg)  12/16/18 207 lb (93.9 kg)      Other studies Reviewed: Additional studies/ records that were reviewed today include: none    ASSESSMENT AND PLAN:  1.  Family history of heart disease. Other than maternal grandfather this does not appear to be premature CAD  and his issues may have been related to valvular heart disease. Jacob Morris is completely asymptomatic. We discussed appropriate lifestyle modification with dietary and exercise recommendations. We discussed additional testing and at this point the only thing I would consider would be a coronary calcium score. This may inform how aggressive we need to be with lipid lowering therapy. For now he will defer but give it some thought.  2. Hypercholesterolemia. LDL 138. He would like to try dietary therapy for now. 3. Borderline HTN. Monitor for now.    Current medicines are reviewed at length with the patient today.  The patient does not have concerns regarding medicines.  The following changes have been made:  no change  Labs/ tests ordered today include:  No orders of the defined types were placed in this encounter.    Disposition:   FU with me PRN  Signed, Jacob Freedman [place], MD  01/31/2019 12:37 PM    [facility] [address], [place], [place], [address] Phone [phone], Fax [phone]

## 2019-01-31 ENCOUNTER — Ambulatory Visit: Payer: BLUE CROSS/BLUE SHIELD | Admitting: Cardiology

## 2019-01-31 ENCOUNTER — Encounter: Payer: Self-pay | Admitting: Cardiology

## 2019-01-31 VITALS — BP 142/90 | HR 65 | Ht 73.0 in | Wt 204.6 lb

## 2019-01-31 DIAGNOSIS — R030 Elevated blood-pressure reading, without diagnosis of hypertension: Secondary | ICD-10-CM | POA: Diagnosis not present

## 2019-01-31 DIAGNOSIS — Z8249 Family history of ischemic heart disease and other diseases of the circulatory system: Secondary | ICD-10-CM | POA: Diagnosis not present

## 2019-04-19 ENCOUNTER — Other Ambulatory Visit: Payer: Self-pay | Admitting: Family Medicine

## 2019-04-21 ENCOUNTER — Telehealth: Payer: Self-pay | Admitting: Family Medicine

## 2019-04-21 MED ORDER — VITAMIN D (ERGOCALCIFEROL) 1.25 MG (50000 UNIT) PO CAPS: 50000.0000 [IU] | ORAL_CAPSULE | ORAL | 3 refills | Status: DC

## 2019-04-21 NOTE — Telephone Encounter (Signed)
PT is needing a refill on Vitamin D, Ergocalciferol, (DRISDOL) 1.25 MG (50000 UT) CAPS capsule send to [facility]

## 2019-04-21 NOTE — Telephone Encounter (Signed)
Refill sent to [facility]

## 2019-04-25 IMAGING — DX DG CHEST 2V
2 series · 2 of 2 positions shown · non-contrast
Comparison: November 07, 2016

CLINICAL DATA: Annual physical examination

EXAM:
CHEST - 2 VIEW

[chest pa]
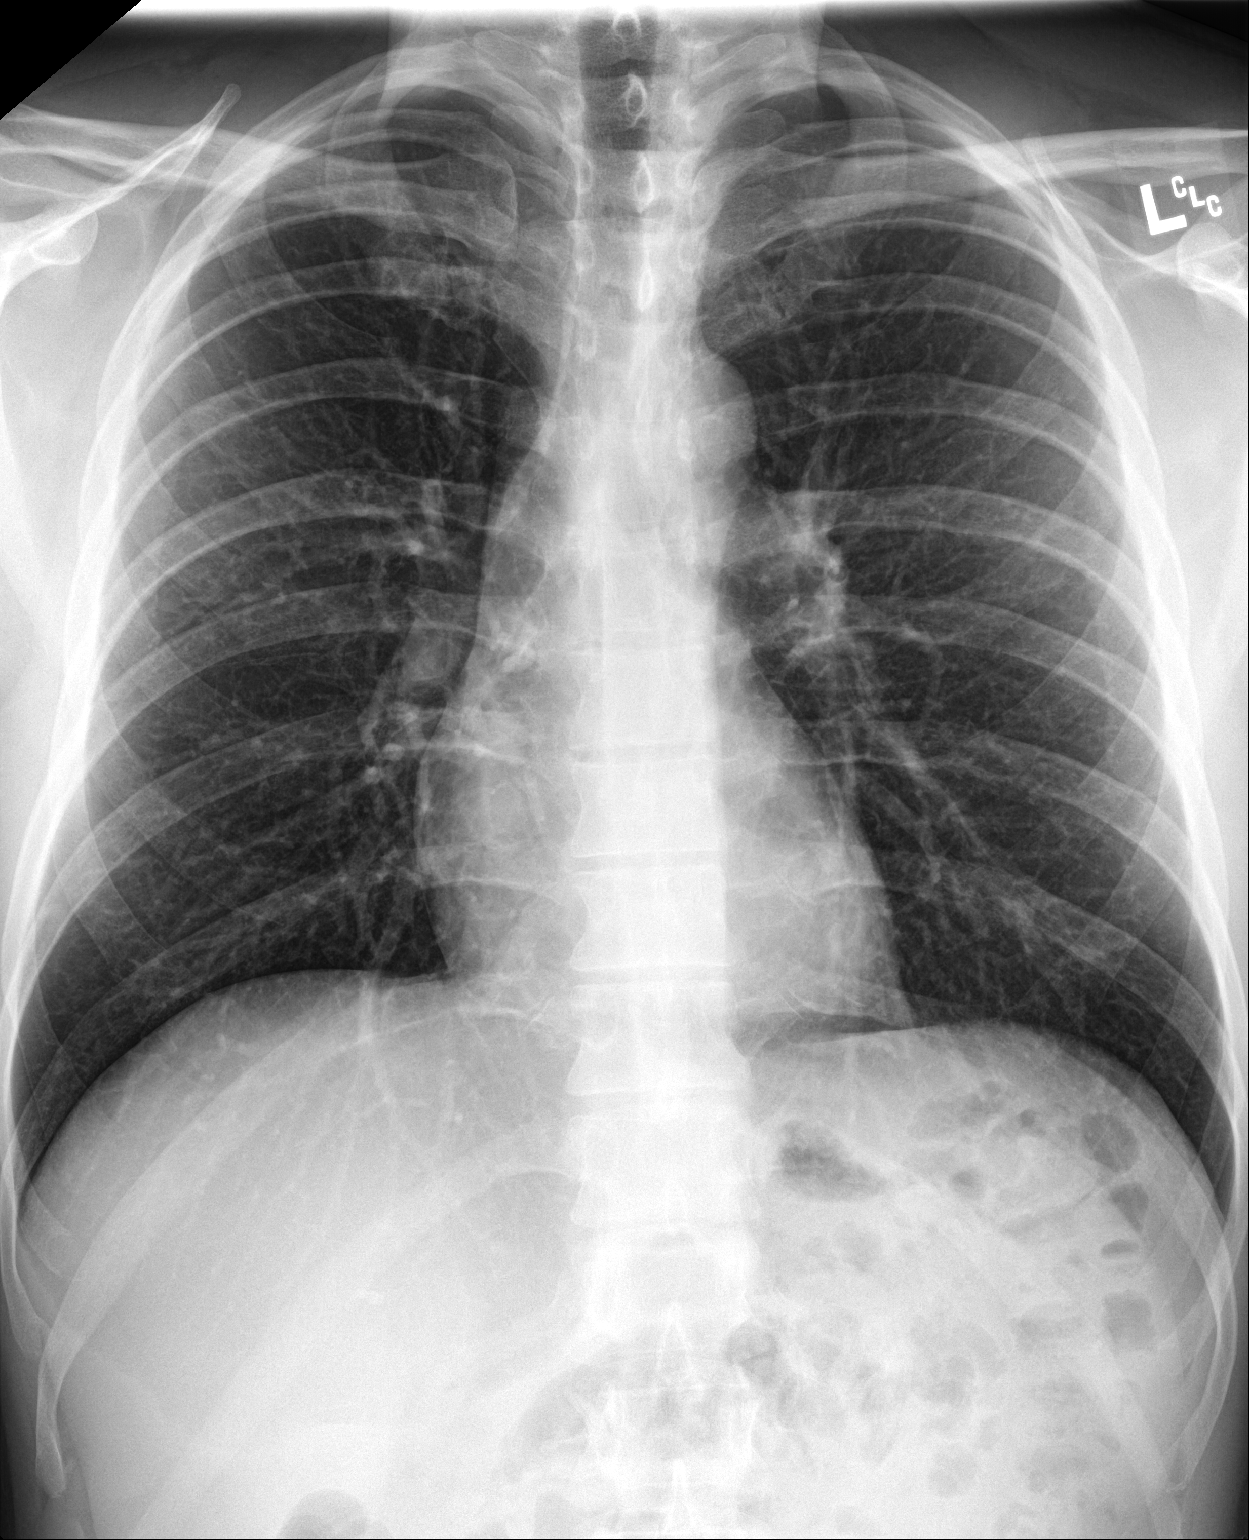

[chest lat]
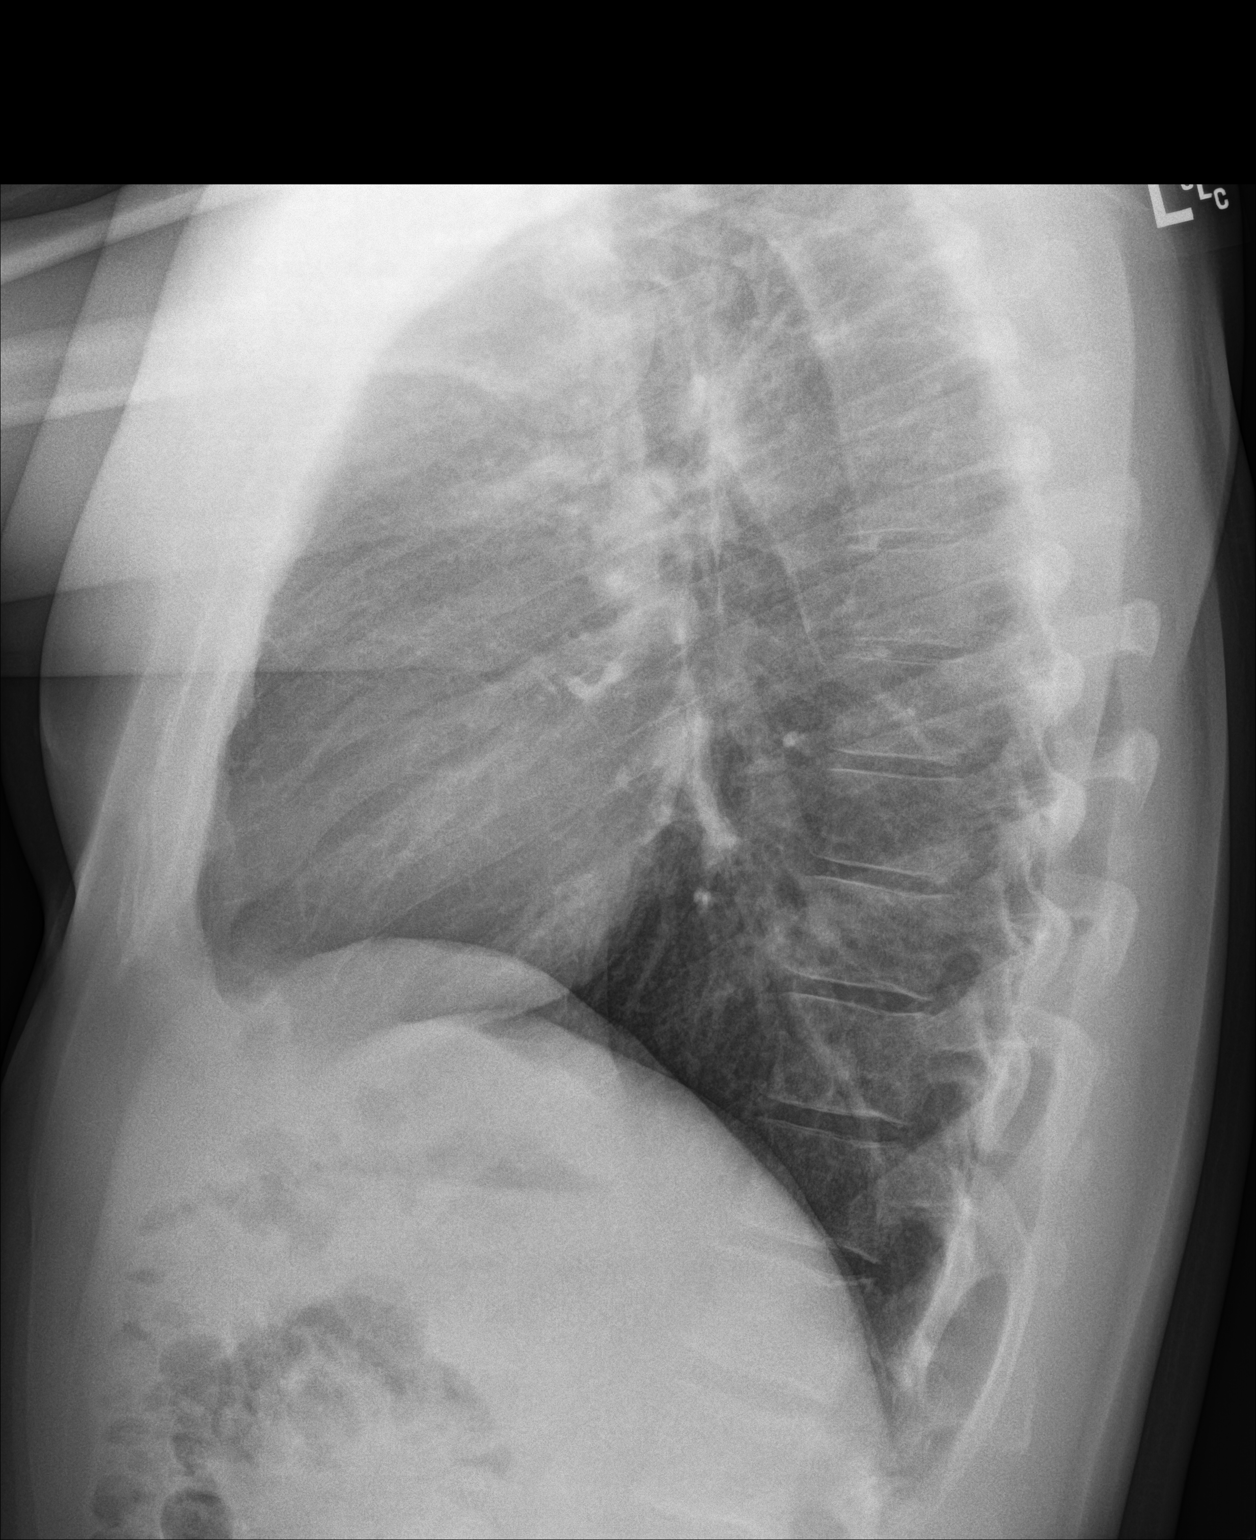

[2 of 2 positions shown; findings below may reference images not displayed]

FINDINGS: The lungs are clear. The heart size and pulmonary vascularity are
normal. No adenopathy. No bone lesions.
IMPRESSION: No abnormality noted.

## 2019-06-15 ENCOUNTER — Telehealth: Payer: Self-pay | Admitting: Family Medicine

## 2019-06-20 ENCOUNTER — Ambulatory Visit: Payer: BLUE CROSS/BLUE SHIELD | Admitting: Family Medicine

## 2019-08-23 ENCOUNTER — Other Ambulatory Visit: Payer: Self-pay

## 2019-08-24 ENCOUNTER — Ambulatory Visit: Payer: BLUE CROSS/BLUE SHIELD | Admitting: Family Medicine

## 2019-08-24 ENCOUNTER — Encounter: Payer: Self-pay | Admitting: Family Medicine

## 2019-08-24 VITALS — BP 126/78 | HR 60 | Temp 97.3°F | Resp 16 | Ht 73.0 in | Wt 205.0 lb

## 2019-08-24 DIAGNOSIS — E291 Testicular hypofunction: Secondary | ICD-10-CM

## 2019-08-24 MED ORDER — TESTOSTERONE CYPIONATE 200 MG/ML IM SOLN: INTRAMUSCULAR | 1 refills | Status: DC

## 2019-08-24 MED ORDER — "SYRINGE 25G X 1"" 3 ML MISC": 3 refills | Status: DC

## 2019-08-24 MED ORDER — VITAMIN D (ERGOCALCIFEROL) 1.25 MG (50000 UNIT) PO CAPS: 50000.0000 [IU] | ORAL_CAPSULE | ORAL | 1 refills | Status: DC

## 2019-08-24 NOTE — Progress Notes (Signed)
Subjective:  Patient ID: Jacob Morris, male    DOB: 1976-05-07  Age: 43 y.o. MRN: [id number]  CC: Medical Management of Chronic Issues (Establish care. Patient of Dr Laurance Flatten)   HPI Jacob Morris presents for follow up of testosterone deficiency. Feels that he is doing well. Energy is good.Libido good. Denies E.D. Has been on testosterone injection for many years.  He is concerned about blood clotting. Factor 5 Leiden runs in the family. Would like to be tested. He understands that testosterone supplement may increase risk of intravasscular clotting. Denies having had any.   Depression screen [facility] 2/9 08/24/2019 01/10/2019 12/16/2018  Decreased Interest 0 0 0  Down, Depressed, Hopeless 0 0 0  PHQ - 2 Score 0 0 0    History Jacob Morris has a past medical history of Hyperlipidemia and Low testosterone.   He has a past surgical history that includes Vasectomy.   His family history includes Asthma in his mother; Heart attack in his maternal grandmother; Heart disease in his maternal grandfather and maternal grandmother; Hypertension in his mother; Other in his father.He reports that he has never smoked. He has never used smokeless tobacco. He reports current alcohol use. He reports that he does not use drugs.    ROS Review of Systems  Constitutional: Negative.   HENT: Negative.   Eyes: Negative for visual disturbance.  Respiratory: Negative for cough and shortness of breath.   Cardiovascular: Negative for chest pain and leg swelling.  Gastrointestinal: Negative for abdominal pain, diarrhea, nausea and vomiting.  Genitourinary: Negative for difficulty urinating.  Musculoskeletal: Negative for arthralgias and myalgias.  Skin: Negative for rash.  Neurological: Negative for headaches.  Psychiatric/Behavioral: Negative for sleep disturbance.    Objective:  BP 126/78   Pulse 60   Temp (!) 97.3 F (36.3 C) (Oral)   Resp 16   Ht _0  (1.854 m)   Wt 205 lb (93 kg)   SpO2 100%    BMI 27.05 kg/m   BP Readings from Last 3 Encounters:  08/24/19 126/78  01/31/19 (!) 142/90  01/10/19 135/84    Wt Readings from Last 3 Encounters:  08/24/19 205 lb (93 kg)  01/31/19 204 lb 9.6 oz (92.8 kg)  01/10/19 206 lb (93.4 kg)     Physical Exam Constitutional:      General: He is not in acute distress.    Appearance: He is well-developed.  HENT:     Head: Normocephalic and atraumatic.     Right Ear: External ear normal.     Left Ear: External ear normal.     Nose: Nose normal.  Eyes:     Conjunctiva/sclera: Conjunctivae normal.     Pupils: Pupils are equal, round, and reactive to light.  Neck:     Musculoskeletal: Normal range of motion and neck supple.  Cardiovascular:     Rate and Rhythm: Normal rate and regular rhythm.     Heart sounds: Normal heart sounds. No murmur.  Pulmonary:     Effort: Pulmonary effort is normal. No respiratory distress.     Breath sounds: Normal breath sounds. No wheezing or rales.  Abdominal:     Palpations: Abdomen is soft.     Tenderness: There is no abdominal tenderness.  Musculoskeletal: Normal range of motion.  Skin:    General: Skin is warm and dry.  Neurological:     Mental Status: He is alert and oriented to person, place, and time.     Deep Tendon Reflexes: Reflexes  are normal and symmetric.  Psychiatric:        Behavior: Behavior normal.        Thought Content: Thought content normal.        Judgment: Judgment normal.       Assessment & Plan:   Jacob Morris was seen today for medical management of chronic issues.  Diagnoses and all orders for this visit:  Hypogonadism male -     CBC with Differential/Platelet -     CMP14+EGFR -     Testosterone,Free and Total -     VITAMIN D 25 Hydroxy (Vit-D Deficiency, Fractures) -     Factor 5 leiden -     Lipid panel  Other orders -     Discontinue: testosterone cypionate (DEPOTESTOSTERONE CYPIONATE) 200 MG/ML injection; INJECT 0.5ML INTO THE      MUSCLE EVERY 10 DAYS.       DISCARD THE REMAINDER -     Discontinue: Syringe/Needle, Disp, (SYRINGE 3CC/25GX1") 25G X 1" 3 ML MISC; Use to inject testosterone every 10 days -     Discontinue: Vitamin D, Ergocalciferol, (DRISDOL) 1.25 MG (50000 UT) CAPS capsule; Take 1 capsule (50,000 Units total) by mouth every 7 (seven) days. -     testosterone cypionate (DEPOTESTOSTERONE CYPIONATE) 200 MG/ML injection; INJECT 0.5ML INTO THE      MUSCLE EVERY 10 DAYS.      DISCARD THE REMAINDER -     Syringe/Needle, Disp, (SYRINGE 3CC/25GX1") 25G X 1" 3 ML MISC; Use to inject testosterone every 10 days -     Vitamin D, Ergocalciferol, (DRISDOL) 1.25 MG (50000 UT) CAPS capsule; Take 1 capsule (50,000 Units total) by mouth every 7 (seven) days.       I am having Jacob Morris "Jacob Morris" maintain his testosterone cypionate, SYRINGE 3CC/25GX1", and Vitamin D (Ergocalciferol).  Allergies as of 08/24/2019   No Known Allergies     Medication List       Accurate as of August 24, 2019 11:59 PM. If you have any questions, ask your nurse or doctor.        SYRINGE 3CC/25GX1" 25G X 1" 3 ML Misc Use to inject testosterone every 10 days   testosterone cypionate 200 MG/ML injection Commonly known as: DEPOTESTOSTERONE CYPIONATE INJECT 0.5ML INTO THE      MUSCLE EVERY 10 DAYS.      DISCARD THE REMAINDER   Vitamin D (Ergocalciferol) 1.25 MG (50000 UT) Caps capsule Commonly known as: DRISDOL Take 1 capsule (50,000 Units total) by mouth every 7 (seven) days.        Follow-up: Return in about 6 months (around 02/21/2020).  [name], M.D.

## 2019-08-29 LAB — LIPID PANEL
Chol/HDL Ratio: 4.1 ratio (ref 0.0–5.0)
Cholesterol, Total: 190 mg/dL (ref 100–199)
HDL: 46 mg/dL (ref >39)
LDL Chol Calc (NIH): 125 mg/dL — ABNORMAL HIGH (ref 0–99)
Triglycerides: 107 mg/dL (ref 0–149)
VLDL Cholesterol Cal: 19 mg/dL (ref 5–40)

## 2019-08-29 LAB — CMP14+EGFR
ALT: 17 IU/L (ref 0–44)
AST: 21 IU/L (ref 0–40)
Albumin/Globulin Ratio: 1.8 (ref 1.2–2.2)
Albumin: 4.6 g/dL (ref 4.0–5.0)
Alkaline Phosphatase: 45 IU/L (ref 39–117)
BUN/Creatinine Ratio: 12 (ref 9–20)
BUN: 12 mg/dL (ref 6–24)
Bilirubin Total: 0.5 mg/dL (ref 0.0–1.2)
CO2: 27 mmol/L (ref 20–29)
Calcium: 9.3 mg/dL (ref 8.7–10.2)
Chloride: 100 mmol/L (ref 96–106)
Creatinine, Ser: 1.04 mg/dL (ref 0.76–1.27)
GFR calc Af Amer: 100 mL/min/{1.73_m2} (ref >59)
GFR calc non Af Amer: 87 mL/min/{1.73_m2} (ref >59)
Globulin, Total: 2.5 g/dL (ref 1.5–4.5)
Glucose: 101 mg/dL — ABNORMAL HIGH (ref 65–99)
Potassium: 5.5 mmol/L — ABNORMAL HIGH (ref 3.5–5.2)
Sodium: 140 mmol/L (ref 134–144)
Total Protein: 7.1 g/dL (ref 6.0–8.5)

## 2019-08-29 LAB — FACTOR 5 LEIDEN

## 2019-08-29 LAB — CBC WITH DIFFERENTIAL/PLATELET
Basophils Absolute: 0 10*3/uL (ref 0.0–0.2)
Basos: 0 %
EOS (ABSOLUTE): 0.2 10*3/uL (ref 0.0–0.4)
Eos: 4 %
Hematocrit: 46.8 % (ref 37.5–51.0)
Hemoglobin: 15.8 g/dL (ref 13.0–17.7)
Immature Grans (Abs): 0 10*3/uL (ref 0.0–0.1)
Immature Granulocytes: 0 %
Lymphocytes Absolute: 1.9 10*3/uL (ref 0.7–3.1)
Lymphs: 36 %
MCH: 29.6 pg (ref 26.6–33.0)
MCHC: 33.8 g/dL (ref 31.5–35.7)
MCV: 88 fL (ref 79–97)
Monocytes Absolute: 0.4 10*3/uL (ref 0.1–0.9)
Monocytes: 8 %
Neutrophils Absolute: 2.7 10*3/uL (ref 1.4–7.0)
Neutrophils: 52 %
Platelets: 249 10*3/uL (ref 150–450)
RBC: 5.33 x10E6/uL (ref 4.14–5.80)
RDW: 13.1 % (ref 11.6–15.4)
WBC: 5.2 10*3/uL (ref 3.4–10.8)

## 2019-08-29 LAB — VITAMIN D 25 HYDROXY (VIT D DEFICIENCY, FRACTURES): Vit D, 25-Hydroxy: 40.2 ng/mL (ref 30.0–100.0)

## 2019-08-29 LAB — TESTOSTERONE,FREE AND TOTAL
Testosterone, Free: 12.6 pg/mL (ref 6.8–21.5)
Testosterone: 441 ng/dL (ref 264–916)

## 2019-11-16 DIAGNOSIS — D235 Other benign neoplasm of skin of trunk: Secondary | ICD-10-CM | POA: Diagnosis not present

## 2019-11-16 DIAGNOSIS — D1801 Hemangioma of skin and subcutaneous tissue: Secondary | ICD-10-CM | POA: Diagnosis not present

## 2019-11-16 DIAGNOSIS — L819 Disorder of pigmentation, unspecified: Secondary | ICD-10-CM | POA: Diagnosis not present

## 2020-01-31 ENCOUNTER — Other Ambulatory Visit: Payer: Self-pay | Admitting: Family Medicine

## 2020-03-20 ENCOUNTER — Other Ambulatory Visit: Payer: Self-pay | Admitting: *Deleted

## 2020-04-03 ENCOUNTER — Encounter: Payer: Self-pay | Admitting: Family Medicine

## 2020-04-03 ENCOUNTER — Ambulatory Visit: Payer: BC Managed Care – PPO | Admitting: Family Medicine

## 2020-04-03 ENCOUNTER — Other Ambulatory Visit: Payer: Self-pay

## 2020-04-03 VITALS — BP 131/82 | HR 56 | Temp 97.3°F | Resp 20 | Ht 73.0 in | Wt 204.5 lb

## 2020-04-03 DIAGNOSIS — E291 Testicular hypofunction: Secondary | ICD-10-CM

## 2020-04-03 DIAGNOSIS — Z114 Encounter for screening for human immunodeficiency virus [HIV]: Secondary | ICD-10-CM

## 2020-04-03 DIAGNOSIS — E559 Vitamin D deficiency, unspecified: Secondary | ICD-10-CM | POA: Diagnosis not present

## 2020-04-03 MED ORDER — "BD LUER-LOK SYRINGE 25G X 1"" 3 ML MISC": 3 refills | Status: DC

## 2020-04-03 MED ORDER — VITAMIN D (ERGOCALCIFEROL) 1.25 MG (50000 UNIT) PO CAPS: 50000.0000 [IU] | ORAL_CAPSULE | ORAL | 1 refills | Status: DC

## 2020-04-03 NOTE — Progress Notes (Signed)
Subjective:  Patient ID: Jacob Morris, male    DOB: [birth year]  Age: 44 y.o. MRN: [id number]  CC: Medical Management of Chronic Issues   HPI Jacob Morris presents for Follow up for testosterone deficiency: Pt. Using medication as directed. Denies any sx referrable to DVT such as edema or erythema of legs. No dyspnea or chest pain. Energy level reported as being good. Libido is normal and denies E.D. Feels strength is adequate and improved from baseline.   Depression screen [facility] 2/9 04/03/2020 08/24/2019 01/10/2019  Decreased Interest 0 0 0  Down, Depressed, Hopeless 0 0 0  PHQ - 2 Score 0 0 0    History Jacob Morris has a past medical history of Hyperlipidemia and Low testosterone.   He has a past surgical history that includes Vasectomy.   His family history includes Asthma in his mother; Heart attack in his maternal grandmother; Heart disease in his maternal grandfather and maternal grandmother; Hypertension in his mother; Other in his father.He reports that he has never smoked. He has never used smokeless tobacco. He reports current alcohol use. He reports that he does not use drugs.    ROS Review of Systems  Constitutional: Negative.   HENT: Negative.   Eyes: Negative for visual disturbance.  Respiratory: Negative for cough and shortness of breath.   Cardiovascular: Negative for chest pain and leg swelling.  Gastrointestinal: Negative for abdominal pain, diarrhea, nausea and vomiting.  Genitourinary: Negative for difficulty urinating.  Musculoskeletal: Negative for arthralgias and myalgias.  Skin: Negative for rash.  Neurological: Negative for headaches.  Psychiatric/Behavioral: Negative for sleep disturbance.    Objective:  BP 131/82   Pulse (!) 56   Temp (!) 97.3 F (36.3 C) (Oral)   Resp 20   Ht _0  (1.854 m)   Wt 204 lb 8 oz (92.8 kg)   SpO2 99%   BMI 26.98 kg/m   BP Readings from Last 3 Encounters:  04/03/20 131/82  08/24/19 126/78  01/31/19  (!) 142/90    Wt Readings from Last 3 Encounters:  04/03/20 204 lb 8 oz (92.8 kg)  08/24/19 205 lb (93 kg)  01/31/19 204 lb 9.6 oz (92.8 kg)     Physical Exam Vitals reviewed.  Constitutional:      Appearance: He is well-developed.  HENT:     Head: Normocephalic and atraumatic.     Right Ear: External ear normal.     Left Ear: External ear normal.     Mouth/Throat:     Pharynx: No oropharyngeal exudate or posterior oropharyngeal erythema.  Eyes:     Pupils: Pupils are equal, round, and reactive to light.  Cardiovascular:     Rate and Rhythm: Normal rate and regular rhythm.     Heart sounds: No murmur.  Pulmonary:     Effort: No respiratory distress.     Breath sounds: Normal breath sounds.  Musculoskeletal:     Cervical back: Normal range of motion and neck supple.  Neurological:     Mental Status: He is alert and oriented to person, place, and time.       Assessment & Plan:   Jacob Morris was seen today for medical management of chronic issues.  Diagnoses and all orders for this visit:  Hypogonadism male -     CBC with Differential/Platelet; Future -     CMP14+EGFR; Future -     Lipid panel; Future -     PSA Total (Reflex To Free); Future -  Testosterone,Free and Total; Future -     TSH; Future -     HIV Antibody (routine testing w rflx)  Vitamin D deficiency -     VITAMIN D 25 Hydroxy (Vit-D Deficiency, Fractures); Future  Encounter for screening for HIV -     HIV Antibody (routine testing w rflx)  Other orders -     SYRINGE-NEEDLE, DISP, 3 ML (B-D 3CC LUER-LOK SYR 25GX1") 25G X 1" 3 ML MISC; USE TO INJECT TESTOSTERONE EVERY 10 DAYS -     Vitamin D, Ergocalciferol, (DRISDOL) 1.25 MG (50000 UNIT) CAPS capsule; Take 1 capsule (50,000 Units total) by mouth every 7 (seven) days.    Labs to be drawn just prior to next testosterone injection.   I have changed Jacob Hews "Mickey"'s Vitamin D (Ergocalciferol). I am also having him maintain his  testosterone cypionate and B-D 3CC LUER-LOK SYR 25GX1".  Allergies as of 04/03/2020   No Known Allergies     Medication List       Accurate as of April 03, 2020  8:52 PM. If you have any questions, ask your nurse or doctor.        B-D 3CC LUER-LOK SYR 25GX1" 25G X 1" 3 ML Misc Generic drug: SYRINGE-NEEDLE (DISP) 3 ML USE TO INJECT TESTOSTERONE EVERY 10 DAYS   testosterone cypionate 200 MG/ML injection Commonly known as: DEPOTESTOSTERONE CYPIONATE INJECT 0.5ML INTO THE      MUSCLE EVERY 10 DAYS.      DISCARD THE REMAINDER   Vitamin D (Ergocalciferol) 1.25 MG (50000 UNIT) Caps capsule Commonly known as: DRISDOL Take 1 capsule (50,000 Units total) by mouth every 7 (seven) days.        Follow-up: Return in about 6 months (around 10/03/2020) for [facility]ete physical.  [name], M.D.

## 2020-04-19 ENCOUNTER — Other Ambulatory Visit: Payer: BC Managed Care – PPO

## 2020-04-19 ENCOUNTER — Other Ambulatory Visit: Payer: Self-pay

## 2020-04-19 ENCOUNTER — Telehealth: Payer: Self-pay | Admitting: Family Medicine

## 2020-04-19 DIAGNOSIS — E559 Vitamin D deficiency, unspecified: Secondary | ICD-10-CM | POA: Diagnosis not present

## 2020-04-19 DIAGNOSIS — E291 Testicular hypofunction: Secondary | ICD-10-CM

## 2020-04-19 NOTE — Telephone Encounter (Signed)
  REFERRAL REQUEST Telephone Note 04/19/2020  What type of referral do you need? Seen [facility] 4[facility]27 and was supposed to do referral to podiatrist for numbness and pain in left big toe.  Have you been seen at our office for this problem? yes (Advise that they may need an appointment with their PCP before a referral can be done)  Is there a particular doctor or location that you prefer? Wherever [facility] wants to send him  Patient notified that referrals can take up to a week or longer to process. If they haven't heard anything within a week they should call back and speak with the referral department.

## 2020-04-23 ENCOUNTER — Telehealth: Payer: Self-pay | Admitting: Family Medicine

## 2020-04-23 ENCOUNTER — Other Ambulatory Visit: Payer: Self-pay | Admitting: Family Medicine

## 2020-04-23 DIAGNOSIS — M79673 Pain in unspecified foot: Secondary | ICD-10-CM

## 2020-04-23 MED ORDER — TESTOSTERONE CYPIONATE 200 MG/ML IM SOLN: INTRAMUSCULAR | 0 refills | Status: DC

## 2020-04-23 NOTE — Telephone Encounter (Signed)
  REFERRAL REQUEST Telephone Note 04/23/2020  What type of referral do you need? Podiatrist  Have you been seen at our office for this problem? Yes at last appt on 04/03/2020 with Dr. Livia Snellen (Advise that they may need an appointment with their PCP before a referral can be done)  Is there a particular doctor or location that you prefer? No   Patient notified that referrals can take up to a week or longer to process. If they haven't heard anything within a week they should call back and speak with the referral department.

## 2020-04-23 NOTE — Telephone Encounter (Signed)
Done. Thanks, WS 

## 2020-04-23 NOTE — Telephone Encounter (Signed)
I sent in the referral order. Thanks

## 2020-04-23 NOTE — Progress Notes (Signed)
Hello [name],  Your lab result is normal and/or stable.Some minor variations that are not significant are commonly marked abnormal, but do not represent any medical problem for you.  Best regards, [name], M.D.

## 2020-04-23 NOTE — Telephone Encounter (Signed)
Spoke with patient about his results. He is aware that Dr. Livia Snellen has not reviewed them at this time. I did give him the total and WNL range.   If a prescription for testosterone is needed he would like for it to be sent to his mail order pharmacy [facility]. If there is a problem sending electronically a paper script will be fine. That is how patient has usually been getting the prescription anyway.  Also, at visit in April. Pt c/o numbness and sharp nerve pain in is great toe. Dr. Livia Snellen said that a referral would be placed to podiatry. The patient has not received a phone call. No referral has been placed that I could see.

## 2020-04-24 ENCOUNTER — Telehealth: Payer: Self-pay

## 2020-04-24 LAB — CBC WITH DIFFERENTIAL/PLATELET
Basophils Absolute: 0 10*3/uL (ref 0.0–0.2)
Basos: 1 %
EOS (ABSOLUTE): 0.2 10*3/uL (ref 0.0–0.4)
Eos: 4 %
Hematocrit: 47.7 % (ref 37.5–51.0)
Hemoglobin: 15.9 g/dL (ref 13.0–17.7)
Immature Grans (Abs): 0 10*3/uL (ref 0.0–0.1)
Immature Granulocytes: 0 %
Lymphocytes Absolute: 2.2 10*3/uL (ref 0.7–3.1)
Lymphs: 44 %
MCH: 29.7 pg (ref 26.6–33.0)
MCHC: 33.3 g/dL (ref 31.5–35.7)
MCV: 89 fL (ref 79–97)
Monocytes Absolute: 0.5 10*3/uL (ref 0.1–0.9)
Monocytes: 9 %
Neutrophils Absolute: 2.2 10*3/uL (ref 1.4–7.0)
Neutrophils: 42 %
Platelets: 268 10*3/uL (ref 150–450)
RBC: 5.36 x10E6/uL (ref 4.14–5.80)
RDW: 13.3 % (ref 11.6–15.4)
WBC: 5.1 10*3/uL (ref 3.4–10.8)

## 2020-04-24 LAB — CMP14+EGFR
ALT: 18 IU/L (ref 0–44)
AST: 26 IU/L (ref 0–40)
Albumin/Globulin Ratio: 2 (ref 1.2–2.2)
Albumin: 4.8 g/dL (ref 4.0–5.0)
Alkaline Phosphatase: 45 IU/L (ref 39–117)
BUN/Creatinine Ratio: 10 (ref 9–20)
BUN: 10 mg/dL (ref 6–24)
Bilirubin Total: 0.5 mg/dL (ref 0.0–1.2)
CO2: 24 mmol/L (ref 20–29)
Calcium: 9.3 mg/dL (ref 8.7–10.2)
Chloride: 97 mmol/L (ref 96–106)
Creatinine, Ser: 0.97 mg/dL (ref 0.76–1.27)
GFR calc Af Amer: 109 mL/min/{1.73_m2} (ref >59)
GFR calc non Af Amer: 95 mL/min/{1.73_m2} (ref >59)
Globulin, Total: 2.4 g/dL (ref 1.5–4.5)
Glucose: 91 mg/dL (ref 65–99)
Potassium: 4.3 mmol/L (ref 3.5–5.2)
Sodium: 134 mmol/L (ref 134–144)
Total Protein: 7.2 g/dL (ref 6.0–8.5)

## 2020-04-24 LAB — PSA TOTAL (REFLEX TO FREE): Prostate Specific Ag, Serum: 0.5 ng/mL (ref 0.0–4.0)

## 2020-04-24 LAB — TESTOSTERONE,FREE AND TOTAL
Testosterone, Free: 9.4 pg/mL (ref 6.8–21.5)
Testosterone: 379 ng/dL (ref 264–916)

## 2020-04-24 LAB — LIPID PANEL
Chol/HDL Ratio: 3.4 ratio (ref 0.0–5.0)
Cholesterol, Total: 160 mg/dL (ref 100–199)
HDL: 47 mg/dL (ref >39)
LDL Chol Calc (NIH): 90 mg/dL (ref 0–99)
Triglycerides: 132 mg/dL (ref 0–149)
VLDL Cholesterol Cal: 23 mg/dL (ref 5–40)

## 2020-04-24 LAB — VITAMIN D 25 HYDROXY (VIT D DEFICIENCY, FRACTURES): Vit D, 25-Hydroxy: 35.7 ng/mL (ref 30.0–100.0)

## 2020-04-24 LAB — TSH: TSH: 1.94 u[IU]/mL (ref 0.450–4.500)

## 2020-04-24 NOTE — Telephone Encounter (Signed)
Patient aware and verbalizes understanding. 

## 2020-04-24 NOTE — Telephone Encounter (Signed)
C[facility] Ostermiller KeySylvester Harder - PA Case ID: [id number] - Rx #[id number] Need help? Call us at [phone]  Outcome Approvedtoday  PA Case: [id number], Status: Approved, Coverage Starts on: 04/24/2020 12:00:00 AM, Coverage Ends on: 04/24/2021 12:00:00 AM.  Drug Testosterone Cypionate 200MG /ML intramuscular solution Form Copywriter, advertising PA Form Original Claim Info 75 TRANSMISSION FEE UP TO $0.28 MAY APPLY SUBMIT PA REQUEST TO: [email or web address] DRUG REQUIRES PRIOR AUTHORIZATION

## 2020-04-24 NOTE — Telephone Encounter (Signed)
Patient aware.

## 2020-05-08 DIAGNOSIS — G5791 Unspecified mononeuropathy of right lower limb: Secondary | ICD-10-CM | POA: Diagnosis not present

## 2020-05-08 DIAGNOSIS — M79671 Pain in right foot: Secondary | ICD-10-CM | POA: Diagnosis not present

## 2020-07-05 DIAGNOSIS — H6983 Other specified disorders of Eustachian tube, bilateral: Secondary | ICD-10-CM | POA: Diagnosis not present

## 2020-07-05 DIAGNOSIS — J020 Streptococcal pharyngitis: Secondary | ICD-10-CM | POA: Diagnosis not present

## 2020-08-11 DIAGNOSIS — R509 Fever, unspecified: Secondary | ICD-10-CM | POA: Diagnosis not present

## 2020-08-11 DIAGNOSIS — R05 Cough: Secondary | ICD-10-CM | POA: Diagnosis not present

## 2020-08-11 DIAGNOSIS — R0981 Nasal congestion: Secondary | ICD-10-CM | POA: Diagnosis not present

## 2020-08-11 DIAGNOSIS — B349 Viral infection, unspecified: Secondary | ICD-10-CM | POA: Diagnosis not present

## 2020-09-27 ENCOUNTER — Other Ambulatory Visit: Payer: Self-pay

## 2020-09-27 ENCOUNTER — Encounter: Payer: Self-pay | Admitting: Family Medicine

## 2020-09-27 ENCOUNTER — Ambulatory Visit (INDEPENDENT_AMBULATORY_CARE_PROVIDER_SITE_OTHER): Payer: BC Managed Care – PPO | Admitting: Family Medicine

## 2020-09-27 VITALS — BP 132/82 | HR 58 | Temp 97.4°F | Ht 73.0 in | Wt 205.2 lb

## 2020-09-27 DIAGNOSIS — Z125 Encounter for screening for malignant neoplasm of prostate: Secondary | ICD-10-CM | POA: Diagnosis not present

## 2020-09-27 DIAGNOSIS — Z0000 Encounter for general adult medical examination without abnormal findings: Secondary | ICD-10-CM

## 2020-09-27 DIAGNOSIS — Z114 Encounter for screening for human immunodeficiency virus [HIV]: Secondary | ICD-10-CM | POA: Diagnosis not present

## 2020-09-27 DIAGNOSIS — Z136 Encounter for screening for cardiovascular disorders: Secondary | ICD-10-CM | POA: Diagnosis not present

## 2020-09-27 DIAGNOSIS — E291 Testicular hypofunction: Secondary | ICD-10-CM | POA: Diagnosis not present

## 2020-09-27 DIAGNOSIS — Z0001 Encounter for general adult medical examination with abnormal findings: Secondary | ICD-10-CM

## 2020-09-27 DIAGNOSIS — Z1159 Encounter for screening for other viral diseases: Secondary | ICD-10-CM

## 2020-09-27 DIAGNOSIS — E559 Vitamin D deficiency, unspecified: Secondary | ICD-10-CM

## 2020-09-27 LAB — URINALYSIS
Bilirubin, UA: NEGATIVE
Glucose, UA: NEGATIVE
Ketones, UA: NEGATIVE
Leukocytes,UA: NEGATIVE
Nitrite, UA: NEGATIVE
Protein,UA: NEGATIVE
RBC, UA: NEGATIVE
Specific Gravity, UA: 1.015 (ref 1.005–1.030)
Urobilinogen, Ur: 0.2 mg/dL (ref 0.2–1.0)
pH, UA: 8 — ABNORMAL HIGH (ref 5.0–7.5)

## 2020-09-27 LAB — LIPID PANEL

## 2020-09-27 MED ORDER — VITAMIN D (ERGOCALCIFEROL) 1.25 MG (50000 UNIT) PO CAPS: 50000.0000 [IU] | ORAL_CAPSULE | ORAL | 1 refills | Status: DC

## 2020-09-27 MED ORDER — TESTOSTERONE CYPIONATE 200 MG/ML IM SOLN: INTRAMUSCULAR | 1 refills | Status: DC

## 2020-09-27 MED ORDER — "BD LUER-LOK SYRINGE 25G X 1"" 3 ML MISC": 3 refills | Status: DC

## 2020-09-27 NOTE — Progress Notes (Signed)
Subjective:  Patient ID: Jacob Morris, male    DOB: September 01, 1976  Age: 44 y.o. MRN: [id number]  CC: Annual Exam   HPI Jacob Morris presents for Annual physical examination.  His only ongoing medical problem is hypergonadism.  He says it has been there since his mid 51s.  There is no known precipitating factor.  He was able to father four children prior to that time.  He says his sex life is good.  He is taking testosterone shots for supplementation.  He does take vitamin D for vitamin D deficiency as well.  He stays active running 4 miles 3 days a week and doing calisthenics such as poor work for crunches push-ups etc. as well.  He gets occasional indigestion when he eats chili or something similar.  Jacob Morris reports that he had Covid a couple of months ago and was in bed for a few days with a lot of body aches.  His only residual symptom is some pain at the elbows.  He is curious about that but does not desire treatment for it today.  He says his shoulders hurt some when he plays golf but that is minor and again no treatment is desired.  Depression screen [facility] 2/9 09/27/2020 04/03/2020 08/24/2019  Decreased Interest 0 0 0  Down, Depressed, Hopeless 0 0 0  PHQ - 2 Score 0 0 0    History Jacob Morris has a past medical history of Hyperlipidemia and Low testosterone.   He has a past surgical history that includes Vasectomy.   His family history includes Asthma in his mother; Heart attack in his maternal grandmother; Heart disease in his maternal grandfather and maternal grandmother; Hypertension in his mother; Other in his father.He reports that he has never smoked. He has never used smokeless tobacco. He reports current alcohol use. He reports that he does not use drugs.    ROS Review of Systems  Constitutional: Negative for activity change, fatigue and unexpected weight change.  HENT: Negative for congestion, ear pain, hearing loss, postnasal drip and trouble swallowing.     Eyes: Negative for pain and visual disturbance.  Respiratory: Negative for cough, chest tightness and shortness of breath.   Cardiovascular: Negative for chest pain, palpitations and leg swelling.  Gastrointestinal: Negative for abdominal distention, abdominal pain, blood in stool, constipation, diarrhea, nausea and vomiting.  Endocrine: Negative for cold intolerance, heat intolerance and polydipsia.  Genitourinary: Positive for frequency (up to bathroon 1-3 X a night. Drinking a lot of water.). Negative for difficulty urinating, dysuria, flank pain and urgency.  Musculoskeletal: Positive for arthralgias (shoulders hurt after golfing. Elbows hurt since having CoVID 2 months ago. ). Negative for joint swelling.  Skin: Negative for color change, rash and wound.  Neurological: Negative for dizziness, syncope, speech difficulty, weakness, light-headedness, numbness and headaches.  Hematological: Does not bruise/bleed easily.  Psychiatric/Behavioral: Negative for confusion, decreased concentration, dysphoric mood and sleep disturbance. The patient is not nervous/anxious.     Objective:  BP 132/82   Pulse (!) 58   Temp (!) 97.4 F (36.3 C) (Temporal)   Ht 6' 1" (1.854 m)   Wt 205 lb 3.2 oz (93.1 kg)   BMI 27.07 kg/m   BP Readings from Last 3 Encounters:  09/27/20 132/82  04/03/20 131/82  08/24/19 126/78    Wt Readings from Last 3 Encounters:  09/27/20 205 lb 3.2 oz (93.1 kg)  04/03/20 204 lb 8 oz (92.8 kg)  08/24/19 205 lb (93 kg)  Physical Exam Constitutional:      Appearance: He is well-developed.  HENT:     Head: Normocephalic and atraumatic.  Eyes:     Pupils: Pupils are equal, round, and reactive to light.  Neck:     Thyroid: No thyromegaly.     Trachea: No tracheal deviation.  Cardiovascular:     Rate and Rhythm: Normal rate and regular rhythm.     Heart sounds: Normal heart sounds. No murmur heard.  No friction rub. No gallop.   Pulmonary:     Breath sounds:  Normal breath sounds. No wheezing or rales.  Abdominal:     General: Bowel sounds are normal. There is no distension.     Palpations: Abdomen is soft. There is no mass.     Tenderness: There is no abdominal tenderness.     Hernia: There is no hernia in the left inguinal area.  Genitourinary:    Penis: Normal.      Testes: Normal.  Musculoskeletal:        General: Normal range of motion.     Cervical back: Normal range of motion.  Lymphadenopathy:     Cervical: No cervical adenopathy.  Skin:    General: Skin is warm and dry.  Neurological:     Mental Status: He is alert and oriented to person, place, and time.       Assessment & Plan:   Jacob Morris was seen today for annual exam.  Diagnoses and all orders for this visit:  Well adult exam -     CBC with Differential/Platelet -     CMP14+EGFR -     Lipid panel -     PSA Total (Reflex To Free) -     Urinalysis  Screening for prostate cancer -     PSA Total (Reflex To Free)  Encounter for screening for HIV -     HIV Antibody (routine testing w rflx)  Need for hepatitis C screening test -     Hepatitis C antibody  Vitamin D deficiency -     Vitamin D, Ergocalciferol, (DRISDOL) 1.25 MG (50000 UNIT) CAPS capsule; Take 1 capsule (50,000 Units total) by mouth every 7 (seven) days. -     CMP14+EGFR -     VITAMIN D 25 Hydroxy (Vit-D Deficiency, Fractures)  Hypogonadism male -     testosterone cypionate (DEPOTESTOSTERONE CYPIONATE) 200 MG/ML injection; INJECT 0.5ML INTO THE      MUSCLE EVERY 10 DAYS.      DISCARD THE REMAINDER -     SYRINGE-NEEDLE, DISP, 3 ML (B-D 3CC LUER-LOK SYR 25GX1") 25G X 1" 3 ML MISC; USE TO INJECT TESTOSTERONE EVERY 10 DAYS -     CBC with Differential/Platelet -     CMP14+EGFR -     Testosterone,Free and Total -     Urinalysis       I am having Jacob Morris "Mickey" maintain his Vitamin D (Ergocalciferol), testosterone cypionate, and B-D 3CC LUER-LOK SYR 25GX1".  Allergies as of 09/27/2020    No Known Allergies     Medication List       Accurate as of September 27, 2020 10:05 AM. If you have any questions, ask your nurse or doctor.        B-D 3CC LUER-LOK SYR 25GX1" 25G X 1" 3 ML Misc Generic drug: SYRINGE-NEEDLE (DISP) 3 ML USE TO INJECT TESTOSTERONE EVERY 10 DAYS   testosterone cypionate 200 MG/ML injection Commonly known as: DEPOTESTOSTERONE CYPIONATE INJECT 0.5ML INTO THE  MUSCLE EVERY 10 DAYS.      DISCARD THE REMAINDER   Vitamin D (Ergocalciferol) 1.25 MG (50000 UNIT) Caps capsule Commonly known as: DRISDOL Take 1 capsule (50,000 Units total) by mouth every 7 (seven) days.        Follow-up: Return in about 6 months (around 03/28/2021).  [name], M.D.

## 2020-09-29 LAB — CMP14+EGFR
ALT: 18 IU/L (ref 0–44)
AST: 22 IU/L (ref 0–40)
Albumin/Globulin Ratio: 1.8 (ref 1.2–2.2)
Albumin: 4.7 g/dL (ref 4.0–5.0)
Alkaline Phosphatase: 45 IU/L (ref 44–121)
BUN/Creatinine Ratio: 8 — ABNORMAL LOW (ref 9–20)
BUN: 8 mg/dL (ref 6–24)
Bilirubin Total: 0.4 mg/dL (ref 0.0–1.2)
CO2: 26 mmol/L (ref 20–29)
Calcium: 9.5 mg/dL (ref 8.7–10.2)
Chloride: 98 mmol/L (ref 96–106)
Creatinine, Ser: 1.01 mg/dL (ref 0.76–1.27)
GFR calc Af Amer: 103 mL/min/{1.73_m2} (ref >59)
GFR calc non Af Amer: 89 mL/min/{1.73_m2} (ref >59)
Globulin, Total: 2.6 g/dL (ref 1.5–4.5)
Glucose: 96 mg/dL (ref 65–99)
Potassium: 4.5 mmol/L (ref 3.5–5.2)
Sodium: 138 mmol/L (ref 134–144)
Total Protein: 7.3 g/dL (ref 6.0–8.5)

## 2020-09-29 LAB — CBC WITH DIFFERENTIAL/PLATELET
Basophils Absolute: 0 10*3/uL (ref 0.0–0.2)
Basos: 1 %
EOS (ABSOLUTE): 0.1 10*3/uL (ref 0.0–0.4)
Eos: 2 %
Hematocrit: 46.6 % (ref 37.5–51.0)
Hemoglobin: 15.4 g/dL (ref 13.0–17.7)
Immature Grans (Abs): 0 10*3/uL (ref 0.0–0.1)
Immature Granulocytes: 0 %
Lymphocytes Absolute: 1.8 10*3/uL (ref 0.7–3.1)
Lymphs: 36 %
MCH: 29.6 pg (ref 26.6–33.0)
MCHC: 33 g/dL (ref 31.5–35.7)
MCV: 90 fL (ref 79–97)
Monocytes Absolute: 0.4 10*3/uL (ref 0.1–0.9)
Monocytes: 8 %
Neutrophils Absolute: 2.7 10*3/uL (ref 1.4–7.0)
Neutrophils: 53 %
Platelets: 271 10*3/uL (ref 150–450)
RBC: 5.2 x10E6/uL (ref 4.14–5.80)
RDW: 13.5 % (ref 11.6–15.4)
WBC: 5.1 10*3/uL (ref 3.4–10.8)

## 2020-09-29 LAB — TESTOSTERONE,FREE AND TOTAL
Testosterone, Free: 14.4 pg/mL (ref 6.8–21.5)
Testosterone: 656 ng/dL (ref 264–916)

## 2020-09-29 LAB — LIPID PANEL
Chol/HDL Ratio: 4 ratio (ref 0.0–5.0)
Cholesterol, Total: 192 mg/dL (ref 100–199)
HDL: 48 mg/dL (ref >39)
LDL Chol Calc (NIH): 129 mg/dL — ABNORMAL HIGH (ref 0–99)
Triglycerides: 80 mg/dL (ref 0–149)
VLDL Cholesterol Cal: 15 mg/dL (ref 5–40)

## 2020-09-29 LAB — VITAMIN D 25 HYDROXY (VIT D DEFICIENCY, FRACTURES): Vit D, 25-Hydroxy: 39.3 ng/mL (ref 30.0–100.0)

## 2020-09-29 LAB — PSA TOTAL (REFLEX TO FREE): Prostate Specific Ag, Serum: 0.6 ng/mL (ref 0.0–4.0)

## 2020-09-29 LAB — HEPATITIS C ANTIBODY: Hep C Virus Ab: <0.1 s/co ratio (ref 0.0–0.9)

## 2020-09-29 LAB — HIV ANTIBODY (ROUTINE TESTING W REFLEX): HIV Screen 4th Generation wRfx: NONREACTIVE

## 2020-10-01 NOTE — Progress Notes (Signed)
Hello Jacob Morris,  Your lab result is normal and/or stable.Some minor variations that are not significant are commonly marked abnormal, but do not represent any medical problem for you.  Best regards, [name], M.D.

## 2020-11-23 DIAGNOSIS — Z23 Encounter for immunization: Secondary | ICD-10-CM | POA: Diagnosis not present

## 2020-11-26 DIAGNOSIS — D239 Other benign neoplasm of skin, unspecified: Secondary | ICD-10-CM | POA: Diagnosis not present

## 2020-11-26 DIAGNOSIS — L570 Actinic keratosis: Secondary | ICD-10-CM | POA: Diagnosis not present

## 2021-01-18 ENCOUNTER — Encounter: Payer: Self-pay | Admitting: *Deleted

## 2021-03-21 ENCOUNTER — Ambulatory Visit: Payer: BC Managed Care – PPO | Admitting: Family Medicine

## 2021-03-21 ENCOUNTER — Encounter: Payer: Self-pay | Admitting: Family Medicine

## 2021-03-21 ENCOUNTER — Other Ambulatory Visit: Payer: Self-pay

## 2021-03-21 VITALS — BP 131/81 | HR 56 | Temp 97.6°F | Ht 73.0 in | Wt 203.2 lb

## 2021-03-21 DIAGNOSIS — Z0001 Encounter for general adult medical examination with abnormal findings: Secondary | ICD-10-CM

## 2021-03-21 DIAGNOSIS — E559 Vitamin D deficiency, unspecified: Secondary | ICD-10-CM | POA: Diagnosis not present

## 2021-03-21 DIAGNOSIS — M92521 Juvenile osteochondrosis of tibia tubercle, right leg: Secondary | ICD-10-CM

## 2021-03-21 DIAGNOSIS — E291 Testicular hypofunction: Secondary | ICD-10-CM

## 2021-03-21 DIAGNOSIS — Z0000 Encounter for general adult medical examination without abnormal findings: Secondary | ICD-10-CM

## 2021-03-21 MED ORDER — TESTOSTERONE CYPIONATE 200 MG/ML IM SOLN: INTRAMUSCULAR | 1 refills | Status: DC

## 2021-03-21 MED ORDER — VITAMIN D (ERGOCALCIFEROL) 1.25 MG (50000 UNIT) PO CAPS: 50000.0000 [IU] | ORAL_CAPSULE | ORAL | 1 refills | Status: DC

## 2021-03-21 NOTE — Progress Notes (Signed)
Subjective:  Patient ID: Jacob Morris, male    DOB: 1976-11-19  Age: 45 y.o. MRN: [id number]  CC: Medical Management of Chronic Issues   HPI Jacob Morris presents for RECHECK of testosterone. Feels good at first with each testosterone injection. Feels the drop over the last few days. A little irritable and a little less energetic. Low T discovered in his mid 26s. Had poor lifestyle. Wonders if that contributed.  Right knee pain at tibial tuberosity. Tender around the bone itself.   Depression screen [facility] 2/9 03/21/2021 09/27/2020 04/03/2020  Decreased Interest 0 0 0  Down, Depressed, Hopeless 0 0 0  PHQ - 2 Score 0 0 0    History Jacob Morris has a past medical history of Hyperlipidemia and Low testosterone.   Jacob Morris has a past surgical history that includes Vasectomy.  L His family history includes Asthma in his mother; Heart attack in his maternal grandmother; Heart disease in his maternal grandfather and maternal grandmother; Hypertension in his mother; Other in his father.Jacob Morris reports that Jacob Morris has never smoked. Jacob Morris has never used smokeless tobacco. Jacob Morris reports current alcohol use. Jacob Morris reports that Jacob Morris does not use drugs.    ROS Review of Systems  Constitutional: Negative for activity change, fatigue, fever and unexpected weight change.  HENT: Negative for congestion, ear pain, hearing loss, postnasal drip and trouble swallowing.   Eyes: Negative for pain and visual disturbance.  Respiratory: Negative for cough, chest tightness and shortness of breath.   Cardiovascular: Negative for chest pain, palpitations and leg swelling.  Gastrointestinal: Negative for abdominal distention, abdominal pain, blood in stool, constipation, diarrhea, nausea and vomiting.  Endocrine: Negative for cold intolerance, heat intolerance and polydipsia.  Genitourinary: Negative for difficulty urinating, dysuria, flank pain, frequency and urgency.  Musculoskeletal: Negative for arthralgias and joint  swelling.  Skin: Negative for color change, rash and wound.  Neurological: Negative for dizziness, syncope, speech difficulty, weakness, light-headedness, numbness and headaches.  Hematological: Does not bruise/bleed easily.  Psychiatric/Behavioral: Negative for confusion, decreased concentration, dysphoric mood and sleep disturbance. The patient is not nervous/anxious.     Objective:  BP 131/81   Pulse (!) 56   Temp 97.6 F (36.4 C)   Ht '6\' 1"'  (1.854 m)   Wt 203 lb 3.2 oz (92.2 kg)   SpO2 99%   BMI 26.81 kg/m   BP Readings from Last 3 Encounters:  03/21/21 131/81  09/27/20 132/82  04/03/20 131/82    Wt Readings from Last 3 Encounters:  03/21/21 203 lb 3.2 oz (92.2 kg)  09/27/20 205 lb 3.2 oz (93.1 kg)  04/03/20 204 lb 8 oz (92.8 kg)     Physical Exam Vitals reviewed.  Constitutional:      Appearance: Jacob Morris is well-developed.  HENT:     Head: Normocephalic and atraumatic.     Right Ear: Tympanic membrane and external ear normal. No decreased hearing noted.     Left Ear: Tympanic membrane and external ear normal. No decreased hearing noted.     Mouth/Throat:     Pharynx: No oropharyngeal exudate or posterior oropharyngeal erythema.  Eyes:     Pupils: Pupils are equal, round, and reactive to light.  Neck:     Thyroid: No thyromegaly.     Trachea: No tracheal deviation.  Cardiovascular:     Rate and Rhythm: Normal rate and regular rhythm.     Heart sounds: Normal heart sounds. No murmur heard. No friction rub. No gallop.   Pulmonary:  Effort: No respiratory distress.     Breath sounds: Normal breath sounds. No wheezing or rales.  Abdominal:     General: Bowel sounds are normal. There is no distension.     Palpations: Abdomen is soft. There is no mass.     Tenderness: There is no abdominal tenderness.     Hernia: There is no hernia in the left inguinal area.  Genitourinary:    Penis: Normal.      Testes: Normal.  Musculoskeletal:        General: Tenderness  (right knee over patellar tendon) present. Normal range of motion.     Cervical back: Normal range of motion and neck supple.  Lymphadenopathy:     Cervical: No cervical adenopathy.  Skin:    General: Skin is warm and dry.  Neurological:     Mental Status: Jacob Morris is alert and oriented to person, place, and time.       Assessment & Plan:   Trevell was seen today for medical management of chronic issues.  Diagnoses and all orders for this visit:  Well adult exam -     Urinalysis, Routine w reflex microscopic  Hypogonadism male -     testosterone cypionate (DEPOTESTOSTERONE CYPIONATE) 200 MG/ML injection; INJECT 0.5ML INTO THE      MUSCLE EVERY 10 DAYS.      DISCARD THE REMAINDER -     Testosterone,Free and Total -     CMP14+EGFR -     CBC with Differential/Platelet  Vitamin D deficiency -     Vitamin D, Ergocalciferol, (DRISDOL) 1.25 MG (50000 UNIT) CAPS capsule; Take 1 capsule (50,000 Units total) by mouth every 7 (seven) days. -     VITAMIN D 25 Hydroxy (Vit-D Deficiency, Fractures)  Osgood-Schlatter's disease of right lower extremity       I am having Jacob Hews "Jacob Morris" maintain his B-D 3CC LUER-LOK SYR 25GX1", testosterone cypionate, and Vitamin D (Ergocalciferol).  Allergies as of 03/21/2021   No Known Allergies     Medication List       Accurate as of March 21, 2021 11:59 PM. If you have any questions, ask your nurse or doctor.        B-D 3CC LUER-LOK SYR 25GX1" 25G X 1" 3 ML Misc Generic drug: SYRINGE-NEEDLE (DISP) 3 ML USE TO INJECT TESTOSTERONE EVERY 10 DAYS   testosterone cypionate 200 MG/ML injection Commonly known as: DEPOTESTOSTERONE CYPIONATE INJECT 0.5ML INTO THE      MUSCLE EVERY 10 DAYS.      DISCARD THE REMAINDER   Vitamin D (Ergocalciferol) 1.25 MG (50000 UNIT) Caps capsule Commonly known as: DRISDOL Take 1 capsule (50,000 Units total) by mouth every 7 (seven) days.        Follow-up: Return in about 6 months (around  09/20/2021).  [name], M.D.

## 2021-03-22 ENCOUNTER — Ambulatory Visit: Payer: BC Managed Care – PPO | Admitting: Family Medicine

## 2021-03-23 LAB — CBC WITH DIFFERENTIAL/PLATELET
Basophils Absolute: 0 10*3/uL (ref 0.0–0.2)
Basos: 1 %
EOS (ABSOLUTE): 0.2 10*3/uL (ref 0.0–0.4)
Eos: 3 %
Hematocrit: 47.1 % (ref 37.5–51.0)
Hemoglobin: 15.5 g/dL (ref 13.0–17.7)
Immature Grans (Abs): 0 10*3/uL (ref 0.0–0.1)
Immature Granulocytes: 0 %
Lymphocytes Absolute: 1.9 10*3/uL (ref 0.7–3.1)
Lymphs: 33 %
MCH: 29.2 pg (ref 26.6–33.0)
MCHC: 32.9 g/dL (ref 31.5–35.7)
MCV: 89 fL (ref 79–97)
Monocytes Absolute: 0.5 10*3/uL (ref 0.1–0.9)
Monocytes: 8 %
Neutrophils Absolute: 3.3 10*3/uL (ref 1.4–7.0)
Neutrophils: 55 %
Platelets: 243 10*3/uL (ref 150–450)
RBC: 5.3 x10E6/uL (ref 4.14–5.80)
RDW: 13.6 % (ref 11.6–15.4)
WBC: 5.9 10*3/uL (ref 3.4–10.8)

## 2021-03-23 LAB — CMP14+EGFR
ALT: 19 IU/L (ref 0–44)
AST: 24 IU/L (ref 0–40)
Albumin/Globulin Ratio: 1.9 (ref 1.2–2.2)
Albumin: 4.5 g/dL (ref 4.0–5.0)
Alkaline Phosphatase: 45 IU/L (ref 44–121)
BUN/Creatinine Ratio: 10 (ref 9–20)
BUN: 10 mg/dL (ref 6–24)
Bilirubin Total: 0.5 mg/dL (ref 0.0–1.2)
CO2: 26 mmol/L (ref 20–29)
Calcium: 9.3 mg/dL (ref 8.7–10.2)
Chloride: 95 mmol/L — ABNORMAL LOW (ref 96–106)
Creatinine, Ser: 1.05 mg/dL (ref 0.76–1.27)
Globulin, Total: 2.4 g/dL (ref 1.5–4.5)
Glucose: 103 mg/dL — ABNORMAL HIGH (ref 65–99)
Potassium: 4.2 mmol/L (ref 3.5–5.2)
Sodium: 134 mmol/L (ref 134–144)
Total Protein: 6.9 g/dL (ref 6.0–8.5)
eGFR: 89 mL/min/{1.73_m2} (ref >59)

## 2021-03-23 LAB — VITAMIN D 25 HYDROXY (VIT D DEFICIENCY, FRACTURES): Vit D, 25-Hydroxy: 37.4 ng/mL (ref 30.0–100.0)

## 2021-03-23 LAB — TESTOSTERONE,FREE AND TOTAL
Testosterone, Free: 11.6 pg/mL (ref 6.8–21.5)
Testosterone: 575 ng/dL (ref 264–916)

## 2021-03-25 ENCOUNTER — Encounter: Payer: Self-pay | Admitting: Family Medicine

## 2021-03-25 NOTE — Progress Notes (Signed)
Hello [name],  Your lab result is normal and/or stable.Some minor variations that are not significant are commonly marked abnormal, but do not represent any medical problem for you.  Best regards, [name], M.D.

## 2021-03-28 ENCOUNTER — Telehealth: Payer: Self-pay | Admitting: *Deleted

## 2021-03-28 DIAGNOSIS — E291 Testicular hypofunction: Secondary | ICD-10-CM

## 2021-03-28 NOTE — Telephone Encounter (Signed)
PA for Testosterone Cypionate 200MG /ML intramuscular solution Came in  Key: [id number]   Additional Information Required The member recently filled this medication and will be able to return for their next refill according to their plan limits Not sent yet

## 2021-04-01 NOTE — Telephone Encounter (Signed)
The member recently filled this medication and will be able to return for their next refill according to their plan limits.

## 2021-09-23 ENCOUNTER — Other Ambulatory Visit: Payer: Self-pay

## 2021-09-23 ENCOUNTER — Telehealth: Payer: Self-pay | Admitting: *Deleted

## 2021-09-23 ENCOUNTER — Encounter: Payer: Self-pay | Admitting: Family Medicine

## 2021-09-23 ENCOUNTER — Ambulatory Visit: Payer: BC Managed Care – PPO | Admitting: Family Medicine

## 2021-09-23 VITALS — BP 147/87 | HR 61 | Temp 97.3°F | Ht 73.0 in | Wt 212.2 lb

## 2021-09-23 DIAGNOSIS — E291 Testicular hypofunction: Secondary | ICD-10-CM

## 2021-09-23 MED ORDER — TESTOSTERONE CYPIONATE 200 MG/ML IM SOLN: INTRAMUSCULAR | 1 refills | Status: DC

## 2021-09-23 NOTE — Telephone Encounter (Signed)
Approvedtoday PA Case: [id number], Status: Approved, Coverage Starts on: 09/23/2021 12:00:00 AM, Coverage Ends on: 09/23/2022 12:00:00 AM

## 2021-09-23 NOTE — Patient Instructions (Signed)

## 2021-09-23 NOTE — Telephone Encounter (Signed)
KeyErick Colace - PA Case ID: [id number] - Rx #: [id number] Need help? Call us at [phone] Status Sent to [facility] Testosterone Cypionate 200MG /ML intramuscular solution

## 2021-09-23 NOTE — Progress Notes (Signed)
Subjective:  Patient ID: Jacob Morris, male    DOB: 1976-11-27  Age: 45 y.o. MRN: [id number]  CC: Medical Management of Chronic Issues   HPI Jacob Morris presents for feels a little tired and moody the last couple of days before each injection. Last testosterone injection was 10/14.  (2.5 days ago) Follow up for testosterone deficiency: Pt. Using medication as directed. Denies any sx referrable to DVT such as edema or erythema of legs. No dyspnea or chest pain. Energy level reported as being good. Libido is normal and denies E.D. Feels strength is adequate and improved from baseline.   Depression screen [facility] 2/9 09/23/2021 03/21/2021 09/27/2020  Decreased Interest 0 0 0  Down, Depressed, Hopeless 0 0 0  PHQ - 2 Score 0 0 0    History Jacob Morris has a past medical history of Hyperlipidemia and Low testosterone.   He has a past surgical history that includes Vasectomy.   His family history includes Asthma in his mother; Heart attack in his maternal grandmother; Heart disease in his maternal grandfather and maternal grandmother; Hypertension in his mother; Other in his father.He reports that he has never smoked. He has never used smokeless tobacco. He reports current alcohol use. He reports that he does not use drugs.    ROS Review of Systems  Constitutional:  Negative for fever.  Respiratory:  Negative for shortness of breath.   Cardiovascular:  Negative for chest pain.  Genitourinary:  Negative for difficulty urinating, penile pain and testicular pain.  Musculoskeletal:  Negative for arthralgias.  Skin:  Negative for rash.   Objective:  BP (!) 147/87   Pulse 61   Temp (!) 97.3 F (36.3 C)   Ht _0  (1.854 m)   Wt 212 lb 3.2 oz (96.3 kg)   SpO2 96%   BMI 28.00 kg/m   BP Readings from Last 3 Encounters:  09/23/21 (!) 147/87  03/21/21 131/81  09/27/20 132/82    Wt Readings from Last 3 Encounters:  09/23/21 212 lb 3.2 oz (96.3 kg)  03/21/21 203 lb 3.2 oz  (92.2 kg)  09/27/20 205 lb 3.2 oz (93.1 kg)     Physical Exam Constitutional:      General: He is not in acute distress.    Appearance: He is well-developed.  HENT:     Head: Normocephalic and atraumatic.     Right Ear: External ear normal.     Left Ear: External ear normal.     Nose: Nose normal.  Eyes:     Conjunctiva/sclera: Conjunctivae normal.     Pupils: Pupils are equal, round, and reactive to light.  Cardiovascular:     Rate and Rhythm: Normal rate and regular rhythm.     Heart sounds: Normal heart sounds. No murmur heard. Pulmonary:     Effort: Pulmonary effort is normal. No respiratory distress.     Breath sounds: Normal breath sounds. No wheezing or rales.  Abdominal:     Palpations: Abdomen is soft.     Tenderness: There is no abdominal tenderness.  Musculoskeletal:        General: Normal range of motion.     Cervical back: Normal range of motion and neck supple.  Skin:    General: Skin is warm and dry.  Neurological:     Mental Status: He is alert and oriented to person, place, and time.     Deep Tendon Reflexes: Reflexes are normal and symmetric.  Psychiatric:  Behavior: Behavior normal.        Thought Content: Thought content normal.        Judgment: Judgment normal.      Assessment & Plan:   Irvin was seen today for medical management of chronic issues.  Diagnoses and all orders for this visit:  Hypogonadism male -     CBC with Differential/Platelet -     CMP14+EGFR -     Lipid panel -     Testosterone,Free and Total -     testosterone cypionate (DEPOTESTOSTERONE CYPIONATE) 200 MG/ML injection; INJECT 0.5ML INTO THE      MUSCLE EVERY 10 DAYS.      DISCARD THE REMAINDER      I am having Jacob Hews "Morris" maintain his B-D 3CC LUER-LOK SYR 25GX1", Vitamin D (Ergocalciferol), and testosterone cypionate.  Allergies as of 09/23/2021   No Known Allergies      Medication List        Accurate as of September 23, 2021  9:07  AM. If you have any questions, ask your nurse or doctor.          B-D 3CC LUER-LOK SYR 25GX1" 25G X 1" 3 ML Misc Generic drug: SYRINGE-NEEDLE (DISP) 3 ML USE TO INJECT TESTOSTERONE EVERY 10 DAYS   testosterone cypionate 200 MG/ML injection Commonly known as: DEPOTESTOSTERONE CYPIONATE INJECT 0.5ML INTO THE      MUSCLE EVERY 10 DAYS.      DISCARD THE REMAINDER   Vitamin D (Ergocalciferol) 1.25 MG (50000 UNIT) Caps capsule Commonly known as: DRISDOL Take 1 capsule (50,000 Units total) by mouth every 7 (seven) days.         Follow-up: Return in about 6 months (around 03/24/2022) for [facility]ete physical.  [name], M.D.

## 2021-09-24 LAB — CBC WITH DIFFERENTIAL/PLATELET
Basophils Absolute: 0.1 10*3/uL (ref 0.0–0.2)
Basos: 1 %
EOS (ABSOLUTE): 0.3 10*3/uL (ref 0.0–0.4)
Eos: 5 %
Hematocrit: 48.2 % (ref 37.5–51.0)
Hemoglobin: 16 g/dL (ref 13.0–17.7)
Immature Grans (Abs): 0 10*3/uL (ref 0.0–0.1)
Immature Granulocytes: 1 %
Lymphocytes Absolute: 2.5 10*3/uL (ref 0.7–3.1)
Lymphs: 39 %
MCH: 29.7 pg (ref 26.6–33.0)
MCHC: 33.2 g/dL (ref 31.5–35.7)
MCV: 90 fL (ref 79–97)
Monocytes Absolute: 0.6 10*3/uL (ref 0.1–0.9)
Monocytes: 9 %
Neutrophils Absolute: 3 10*3/uL (ref 1.4–7.0)
Neutrophils: 45 %
Platelets: 248 10*3/uL (ref 150–450)
RBC: 5.38 x10E6/uL (ref 4.14–5.80)
RDW: 13.4 % (ref 11.6–15.4)
WBC: 6.5 10*3/uL (ref 3.4–10.8)

## 2021-09-24 LAB — CMP14+EGFR
ALT: 24 IU/L (ref 0–44)
AST: 29 IU/L (ref 0–40)
Albumin/Globulin Ratio: 2 (ref 1.2–2.2)
Albumin: 4.8 g/dL (ref 4.0–5.0)
Alkaline Phosphatase: 54 IU/L (ref 44–121)
BUN/Creatinine Ratio: 11 (ref 9–20)
BUN: 12 mg/dL (ref 6–24)
Bilirubin Total: 0.4 mg/dL (ref 0.0–1.2)
CO2: 23 mmol/L (ref 20–29)
Calcium: 9.7 mg/dL (ref 8.7–10.2)
Chloride: 98 mmol/L (ref 96–106)
Creatinine, Ser: 1.11 mg/dL (ref 0.76–1.27)
Globulin, Total: 2.4 g/dL (ref 1.5–4.5)
Glucose: 107 mg/dL — ABNORMAL HIGH (ref 70–99)
Potassium: 4.8 mmol/L (ref 3.5–5.2)
Sodium: 138 mmol/L (ref 134–144)
Total Protein: 7.2 g/dL (ref 6.0–8.5)
eGFR: 83 mL/min/{1.73_m2} (ref >59)

## 2021-09-24 LAB — LIPID PANEL
Chol/HDL Ratio: 3.5 ratio (ref 0.0–5.0)
Cholesterol, Total: 194 mg/dL (ref 100–199)
HDL: 55 mg/dL (ref >39)
LDL Chol Calc (NIH): 118 mg/dL — ABNORMAL HIGH (ref 0–99)
Triglycerides: 119 mg/dL (ref 0–149)
VLDL Cholesterol Cal: 21 mg/dL (ref 5–40)

## 2021-09-24 LAB — TESTOSTERONE,FREE AND TOTAL
Testosterone, Free: 16.2 pg/mL (ref 6.8–21.5)
Testosterone: 516 ng/dL (ref 264–916)

## 2021-09-24 NOTE — Progress Notes (Signed)
Hello Jacob Morris,  Your lab result is normal and/or stable.Some minor variations that are not significant are commonly marked abnormal, but do not represent any medical problem for you.  Best regards, [name], M.D.

## 2021-10-24 ENCOUNTER — Other Ambulatory Visit: Payer: Self-pay | Admitting: Family Medicine

## 2021-10-24 DIAGNOSIS — E559 Vitamin D deficiency, unspecified: Secondary | ICD-10-CM

## 2021-11-26 DIAGNOSIS — D239 Other benign neoplasm of skin, unspecified: Secondary | ICD-10-CM | POA: Diagnosis not present

## 2022-03-24 ENCOUNTER — Encounter: Payer: BC Managed Care – PPO | Admitting: Family Medicine

## 2022-04-09 ENCOUNTER — Other Ambulatory Visit: Payer: Self-pay | Admitting: Family Medicine

## 2022-04-09 DIAGNOSIS — E559 Vitamin D deficiency, unspecified: Secondary | ICD-10-CM

## 2022-04-17 ENCOUNTER — Encounter: Payer: Self-pay | Admitting: Family Medicine

## 2022-04-17 ENCOUNTER — Ambulatory Visit: Payer: BC Managed Care – PPO | Admitting: Family Medicine

## 2022-04-17 VITALS — BP 137/85 | HR 74 | Temp 98.8°F | Wt 209.4 lb

## 2022-04-17 DIAGNOSIS — R509 Fever, unspecified: Secondary | ICD-10-CM | POA: Diagnosis not present

## 2022-04-17 LAB — CBC WITH DIFFERENTIAL/PLATELET
Basophils Absolute: 0 10*3/uL (ref 0.0–0.2)
Basos: 0 %
EOS (ABSOLUTE): 0 10*3/uL (ref 0.0–0.4)
Eos: 0 %
Hematocrit: 48.5 % (ref 37.5–51.0)
Hemoglobin: 16.5 g/dL (ref 13.0–17.7)
Immature Grans (Abs): 0 10*3/uL (ref 0.0–0.1)
Immature Granulocytes: 0 %
Lymphocytes Absolute: 0.7 10*3/uL (ref 0.7–3.1)
Lymphs: 16 %
MCH: 29.4 pg (ref 26.6–33.0)
MCHC: 34 g/dL (ref 31.5–35.7)
MCV: 86 fL (ref 79–97)
Monocytes Absolute: 0.7 10*3/uL (ref 0.1–0.9)
Monocytes: 15 %
Neutrophils Absolute: 3.1 10*3/uL (ref 1.4–7.0)
Neutrophils: 69 %
Platelets: 181 10*3/uL (ref 150–450)
RBC: 5.62 x10E6/uL (ref 4.14–5.80)
RDW: 13.1 % (ref 11.6–15.4)
WBC: 4.6 10*3/uL (ref 3.4–10.8)

## 2022-04-17 NOTE — Progress Notes (Signed)
? ?Subjective:  ?Patient ID: Jacob Morris, male    DOB: Dec 17, 1975  Age: 46 y.o. MRN: [id number] ? ?CC: Fatigue and Fever ? ? ?HPI ?Jacob Morris presents for fever, sweats last night. NEck pain, HA. Lungs feel raw. Stomach is tender. Covid negative X 2. Not coughing. Some sinus DC, clear. Some dyspnea with jogging earlier this week. Tired. Not sleeping well. Onset was 4 days ago.  ? ? ?  04/17/2022  ?  9:53 AM 09/23/2021  ?  8:14 AM 03/21/2021  ?  8:31 AM  ?Depression screen PHQ 2/9  ?Decreased Interest 0 0 0  ?Down, Depressed, Hopeless 0 0 0  ?PHQ - 2 Score 0 0 0  ? ? ?History ?Jacob Morris has a past medical history of Hyperlipidemia and Low testosterone.  ? ?He has a past surgical history that includes Vasectomy.  ? ?His family history includes Asthma in his mother; Heart attack in his maternal grandmother; Heart disease in his maternal grandfather and maternal grandmother; Hypertension in his mother; Other in his father.He reports that he has never smoked. He has never used smokeless tobacco. He reports current alcohol use. He reports that he does not use drugs. ? ? ? ?ROS ?Review of Systems  ?Constitutional:  Negative for activity change, appetite change, chills and fever.  ?HENT:  Positive for congestion, postnasal drip, rhinorrhea and sinus pressure. Negative for ear discharge, ear pain, hearing loss, nosebleeds, sneezing and trouble swallowing.   ?Respiratory:  Negative for chest tightness and shortness of breath.   ?Cardiovascular:  Negative for chest pain and palpitations.  ?Skin:  Negative for rash.  ? ?Objective:  ?BP 137/85   Pulse 74   Temp 98.8 ?F (37.1 ?C)   Wt 209 lb 6.4 oz (95 kg)   SpO2 95%   BMI 27.63 kg/m?  ? ?BP Readings from Last 3 Encounters:  ?04/17/22 137/85  ?09/23/21 (!) 147/87  ?03/21/21 131/81  ? ? ?Wt Readings from Last 3 Encounters:  ?04/17/22 209 lb 6.4 oz (95 kg)  ?09/23/21 212 lb 3.2 oz (96.3 kg)  ?03/21/21 203 lb 3.2 oz (92.2 kg)  ? ? ? ?Physical Exam ?Vitals  reviewed.  ?Constitutional:   ?   Appearance: He is well-developed.  ?HENT:  ?   Head: Normocephalic and atraumatic.  ?   Right Ear: External ear normal.  ?   Left Ear: External ear normal.  ?   Mouth/Throat:  ?   Pharynx: No oropharyngeal exudate or posterior oropharyngeal erythema.  ?Eyes:  ?   Pupils: Pupils are equal, round, and reactive to light.  ?Cardiovascular:  ?   Rate and Rhythm: Normal rate and regular rhythm.  ?   Heart sounds: No murmur heard. ?Pulmonary:  ?   Effort: No respiratory distress.  ?   Breath sounds: Normal breath sounds.  ?Musculoskeletal:  ?   Cervical back: Normal range of motion and neck supple.  ?Neurological:  ?   Mental Status: He is alert and oriented to person, place, and time.  ? ? ? ? ?Assessment & Plan:  ? ?Jacob Morris was seen today for fatigue and fever. ? ?Diagnoses and all orders for this visit: ? ?Fever, unspecified fever cause ?-     COVID-19, Flu A+B and RSV ?-     CBC with Differential/Platelet ? ? ? ? ? ? ?I am having Jacob Hews "Mickey" maintain his B-D 3CC LUER-LOK SYR 25GX1", testosterone cypionate, and Vitamin D (Ergocalciferol). ? ?Allergies as of 04/17/2022   ?No  Known Allergies ?  ? ?  ?Medication List  ?  ? ?  ? Accurate as of Apr 17, 2022  8:50 PM. If you have any questions, ask your nurse or doctor.  ?  ?  ? ?  ? ?B-D 3CC LUER-LOK SYR 25GX1" 25G X 1" 3 ML Misc ?Generic drug: SYRINGE-NEEDLE (DISP) 3 ML ?USE TO INJECT TESTOSTERONE EVERY 10 DAYS ?  ?testosterone cypionate 200 MG/ML injection ?Commonly known as: DEPOTESTOSTERONE CYPIONATE ?INJECT 0.5ML INTO THE      MUSCLE EVERY 10 DAYS.      DISCARD THE REMAINDER ?  ?Vitamin D (Ergocalciferol) 1.25 MG (50000 UNIT) Caps capsule ?Commonly known as: DRISDOL ?TAKE 1 CAPSULE EVERY 7 DAYS ?  ? ?  ? ? ? ?Follow-up: Return if symptoms worsen or fail to improve. ? ?[name], M.D. ?

## 2022-04-18 LAB — COVID-19, FLU A+B AND RSV
Influenza A, NAA: NOT DETECTED
Influenza B, NAA: NOT DETECTED
RSV, NAA: NOT DETECTED
SARS-CoV-2, NAA: NOT DETECTED

## 2022-05-01 ENCOUNTER — Ambulatory Visit (INDEPENDENT_AMBULATORY_CARE_PROVIDER_SITE_OTHER): Payer: BC Managed Care – PPO | Admitting: Family Medicine

## 2022-05-01 ENCOUNTER — Encounter: Payer: Self-pay | Admitting: Family Medicine

## 2022-05-01 VITALS — BP 137/77 | HR 60 | Temp 97.9°F | Ht 73.0 in | Wt 208.8 lb

## 2022-05-01 DIAGNOSIS — E291 Testicular hypofunction: Secondary | ICD-10-CM

## 2022-05-01 DIAGNOSIS — Z0001 Encounter for general adult medical examination with abnormal findings: Secondary | ICD-10-CM

## 2022-05-01 DIAGNOSIS — Z0000 Encounter for general adult medical examination without abnormal findings: Secondary | ICD-10-CM | POA: Diagnosis not present

## 2022-05-01 DIAGNOSIS — E559 Vitamin D deficiency, unspecified: Secondary | ICD-10-CM | POA: Diagnosis not present

## 2022-05-01 DIAGNOSIS — Z136 Encounter for screening for cardiovascular disorders: Secondary | ICD-10-CM | POA: Diagnosis not present

## 2022-05-01 LAB — URINALYSIS
Bilirubin, UA: NEGATIVE
Glucose, UA: NEGATIVE
Ketones, UA: NEGATIVE
Leukocytes,UA: NEGATIVE
Nitrite, UA: NEGATIVE
Protein,UA: NEGATIVE
RBC, UA: NEGATIVE
Specific Gravity, UA: 1.01 (ref 1.005–1.030)
Urobilinogen, Ur: 0.2 mg/dL (ref 0.2–1.0)
pH, UA: 7 (ref 5.0–7.5)

## 2022-05-01 MED ORDER — "BD LUER-LOK SYRINGE 25G X 1"" 3 ML MISC": 3 refills | Status: AC

## 2022-05-01 MED ORDER — TESTOSTERONE CYPIONATE 200 MG/ML IM SOLN: INTRAMUSCULAR | 1 refills | Status: DC

## 2022-05-01 MED ORDER — VITAMIN D (ERGOCALCIFEROL) 1.25 MG (50000 UNIT) PO CAPS: ORAL_CAPSULE | ORAL | 1 refills | Status: DC

## 2022-05-01 NOTE — Patient Instructions (Signed)
Your provider wants you to schedule an appointment with a Psychologist/Psychiatrist. The following list of offices requires the patient to call and make their own appointment, as there is information they need that only you can provide. Please feel free to choose form the following providers:  [facility]   [phone] Crisis Recovery in [place] [phone]  [facility]  [phone]   [address] [place], [place]  (Scheduled through [facility]) Must call and do an interview for appointment. Sees Children / Accepts Medicaid  [facility]    [phone]  [address], [address]    [place], [place]       [facility]  [phone] [address] [place], [place]  Evaluates for Autism but does not treat it Sees Children / Accepts Medicaid  [facility]    [phone] [address]   [place], [place] Medication management, substance abuse, bipolar, grief, family, marriage, OCD, anxiety, PTSD Sees children / Accepts Medicaid  [facility]    [phone] [address] [place], [place] Sees children / Accepts Medicaid  [facility]  [phone] [address] [place], [place]   Dr Akinlayo     [phone] [address] [place], [place]  Sees ADD & ADHD for treatment Accepts Medicaid  [facility]  [phone] [address] [place], [place] Evaluates for Autism Accepts Medicaid  [facility]  [phone] [address] [place], [place]  Does Adult ADD evaluations Does not accept Medicaid  [facility]   [phone] [address]   , [place] Uses animal therapy  Sees children as young as 3 years old Accepts Medicaid  [facility]     [phone]    [address]  [place], [place] [address] Sees children Accepts Medicaid  

## 2022-05-01 NOTE — Progress Notes (Signed)
Subjective:  Patient ID: Jacob Morris, male    DOB: Nov 02, 1976  Age: 46 y.o. MRN: [id number]  CC: Annual Exam   HPI Keo Schirmer presents for CPE. Having anxiety. Struggling with it "longer than I recognized."  There is some family conflict over her behavior of his daughter who is a teenager.  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 Testing showed he wasn't where he should be.     05/01/2022    9:20 AM  GAD 7 : Generalized Anxiety Score  Nervous, Anxious, on Edge 1  Control/stop worrying 0  Worry too much - different things 0  Trouble relaxing 0  Restless 0  Easily annoyed or irritable 0  Afraid - awful might happen 0  Total GAD 7 Score 1  Anxiety Difficulty Not difficult at all         04/17/2022    9:53 AM 09/23/2021    8:14 AM 03/21/2021    8:31 AM  Depression screen PHQ 2/9  Decreased Interest 0 0 0  Down, Depressed, Hopeless 0 0 0  PHQ - 2 Score 0 0 0    History Jamarri has a past medical history of Hyperlipidemia and Low testosterone.   He has a past surgical history that includes Vasectomy.   His family history includes Asthma in his mother; Heart attack in his maternal grandmother; Heart disease in his maternal grandfather and maternal grandmother; Hypertension in his mother; Other in his father.He reports that he has never smoked. He has never used smokeless tobacco. He reports current alcohol use. He reports that he does not use drugs.    ROS Review of Systems  Constitutional:  Negative for activity change, fatigue and unexpected weight change.  HENT:  Negative for congestion, ear pain, hearing loss, postnasal drip and trouble swallowing.   Eyes:  Negative for pain and visual disturbance.  Respiratory:  Negative for cough, chest tightness and shortness of breath.   Cardiovascular:  Negative for chest pain, palpitations and leg swelling.  Gastrointestinal:  Negative for abdominal distention, abdominal pain,  blood in stool, constipation, diarrhea, nausea and vomiting.  Endocrine: Negative for cold intolerance, heat intolerance and polydipsia.  Genitourinary:  Negative for difficulty urinating, dysuria, flank pain, frequency and urgency.  Musculoskeletal:  Negative for arthralgias and joint swelling.  Skin:  Negative for color change, rash and wound.  Neurological:  Negative for dizziness, syncope, speech difficulty, weakness, light-headedness, numbness and headaches.  Hematological:  Does not bruise/bleed easily.  Psychiatric/Behavioral:  Positive for dysphoric mood. Negative for confusion, decreased concentration and sleep disturbance. The patient is nervous/anxious.    Objective:  BP 137/77   Pulse 60   Temp 97.9 F (36.6 C)   Ht 6' 1" (1.854 m)   Wt 208 lb 12.8 oz (94.7 kg)   SpO2 97%   BMI 27.55 kg/m   BP Readings from Last 3 Encounters:  05/01/22 137/77  04/17/22 137/85  09/23/21 (!) 147/87    Wt Readings from Last 3 Encounters:  05/01/22 208 lb 12.8 oz (94.7 kg)  04/17/22 209 lb 6.4 oz (95 kg)  09/23/21 212 lb 3.2 oz (96.3 kg)     Physical Exam Constitutional:      Appearance: He is well-developed.  HENT:     Head: Normocephalic and atraumatic.  Eyes:     Pupils: Pupils are equal, round, and reactive to light.  Neck:     Thyroid: No thyromegaly.     Trachea: No tracheal deviation.  Cardiovascular:     Rate and Rhythm: Normal rate and regular rhythm.     Heart sounds: Normal heart sounds. No murmur heard.   No friction rub. No gallop.  Pulmonary:     Breath sounds: Normal breath sounds. No wheezing or rales.  Abdominal:     General: Bowel sounds are normal. There is no distension.     Palpations: Abdomen is soft. There is no mass.     Tenderness: There is no abdominal tenderness.     Hernia: There is no hernia in the left inguinal area.  Genitourinary:    Penis: Normal.      Testes: Normal.  Musculoskeletal:        General: Normal range of motion.      Cervical back: Normal range of motion.  Lymphadenopathy:     Cervical: No cervical adenopathy.  Skin:    General: Skin is warm and dry.  Neurological:     Mental Status: He is alert and oriented to person, place, and time.      Assessment & Plan:   Larenz was seen today for annual exam.  Diagnoses and all orders for this visit:  Well adult exam -     CBC with Differential/Platelet -     CMP14+EGFR -     Lipid panel -     Urinalysis  Hypogonadism male -     testosterone cypionate (DEPOTESTOSTERONE CYPIONATE) 200 MG/ML injection; INJECT 0.5ML INTO THE      MUSCLE EVERY 10 DAYS.      DISCARD THE REMAINDER -     SYRINGE-NEEDLE, DISP, 3 ML (B-D 3CC LUER-LOK SYR 25GX1") 25G X 1" 3 ML MISC; USE TO INJECT TESTOSTERONE EVERY 10 DAYS -     CBC with Differential/Platelet -     CMP14+EGFR -     Testosterone,Free and Total -     PSA, total and free -     Urinalysis  Vitamin D deficiency -     Vitamin D, Ergocalciferol, (DRISDOL) 1.25 MG (50000 UNIT) CAPS capsule; TAKE 1 CAPSULE EVERY 7 DAYS -     CBC with Differential/Platelet -     CMP14+EGFR       I am having [name] maintain his testosterone cypionate, Vitamin D (Ergocalciferol), and B-D 3CC LUER-LOK SYR 25GX1".  Allergies as of 05/01/2022   No Known Allergies      Medication List        Accurate as of May 01, 2022  4:21 PM. If you have any questions, ask your nurse or doctor.          B-D 3CC LUER-LOK SYR 25GX1" 25G X 1" 3 ML Misc Generic drug: SYRINGE-NEEDLE (DISP) 3 ML USE TO INJECT TESTOSTERONE EVERY 10 DAYS   testosterone cypionate 200 MG/ML injection Commonly known as: DEPOTESTOSTERONE CYPIONATE INJECT 0.5ML INTO THE      MUSCLE EVERY 10 DAYS.      DISCARD THE REMAINDER   Vitamin D (Ergocalciferol) 1.25 MG (50000 UNIT) Caps capsule Commonly known as: DRISDOL TAKE 1 CAPSULE EVERY 7 DAYS       After discussion it appears that his anxiety and depression are mild.  I think  counseling would be his best intervention.  He was given a list of the current counselors and psychologist that we recommend.  He will make those contacts.  If there is any difficulty getting started with any of them he will let me know and we will see what we can do  to help.  Follow-up: Return in about 6 months (around 11/01/2022).  [name], M.D.

## 2022-05-03 LAB — PSA, TOTAL AND FREE
PSA, Free Pct: 52 %
PSA, Free: 0.26 ng/mL
Prostate Specific Ag, Serum: 0.5 ng/mL (ref 0.0–4.0)

## 2022-05-03 LAB — CBC WITH DIFFERENTIAL/PLATELET
Basophils Absolute: 0 10*3/uL (ref 0.0–0.2)
Basos: 1 %
EOS (ABSOLUTE): 0.1 10*3/uL (ref 0.0–0.4)
Eos: 2 %
Hematocrit: 43.9 % (ref 37.5–51.0)
Hemoglobin: 15.2 g/dL (ref 13.0–17.7)
Immature Grans (Abs): 0 10*3/uL (ref 0.0–0.1)
Immature Granulocytes: 0 %
Lymphocytes Absolute: 1.8 10*3/uL (ref 0.7–3.1)
Lymphs: 39 %
MCH: 29.7 pg (ref 26.6–33.0)
MCHC: 34.6 g/dL (ref 31.5–35.7)
MCV: 86 fL (ref 79–97)
Monocytes Absolute: 0.4 10*3/uL (ref 0.1–0.9)
Monocytes: 9 %
Neutrophils Absolute: 2.3 10*3/uL (ref 1.4–7.0)
Neutrophils: 49 %
Platelets: 312 10*3/uL (ref 150–450)
RBC: 5.12 x10E6/uL (ref 4.14–5.80)
RDW: 12.9 % (ref 11.6–15.4)
WBC: 4.6 10*3/uL (ref 3.4–10.8)

## 2022-05-03 LAB — CMP14+EGFR
ALT: 19 IU/L (ref 0–44)
AST: 22 IU/L (ref 0–40)
Albumin/Globulin Ratio: 1.6 (ref 1.2–2.2)
Albumin: 4.6 g/dL (ref 4.0–5.0)
Alkaline Phosphatase: 52 IU/L (ref 44–121)
BUN/Creatinine Ratio: 9 (ref 9–20)
BUN: 9 mg/dL (ref 6–24)
Bilirubin Total: 0.5 mg/dL (ref 0.0–1.2)
CO2: 25 mmol/L (ref 20–29)
Calcium: 9.3 mg/dL (ref 8.7–10.2)
Chloride: 99 mmol/L (ref 96–106)
Creatinine, Ser: 1.04 mg/dL (ref 0.76–1.27)
Globulin, Total: 2.8 g/dL (ref 1.5–4.5)
Glucose: 99 mg/dL (ref 70–99)
Potassium: 4.7 mmol/L (ref 3.5–5.2)
Sodium: 139 mmol/L (ref 134–144)
Total Protein: 7.4 g/dL (ref 6.0–8.5)
eGFR: 90 mL/min/{1.73_m2} (ref >59)

## 2022-05-03 LAB — LIPID PANEL
Chol/HDL Ratio: 4.2 ratio (ref 0.0–5.0)
Cholesterol, Total: 191 mg/dL (ref 100–199)
HDL: 45 mg/dL (ref >39)
LDL Chol Calc (NIH): 133 mg/dL — ABNORMAL HIGH (ref 0–99)
Triglycerides: 73 mg/dL (ref 0–149)
VLDL Cholesterol Cal: 13 mg/dL (ref 5–40)

## 2022-05-03 LAB — TESTOSTERONE,FREE AND TOTAL
Testosterone, Free: 18.8 pg/mL (ref 6.8–21.5)
Testosterone: 836 ng/dL (ref 264–916)

## 2022-08-27 ENCOUNTER — Telehealth: Payer: Self-pay | Admitting: *Deleted

## 2022-08-27 NOTE — Telephone Encounter (Signed)
Testosterone Cypionate PA-In Process   Key: [id number] -   PA Case ID: [id number]

## 2022-08-28 ENCOUNTER — Telehealth: Payer: Self-pay

## 2022-08-28 NOTE — Telephone Encounter (Signed)
Approvedon September 20 PA Case: [id number], Status: Approved, Coverage Starts on: 08/27/2022 12:00:00 AM, Coverage Ends on: 08/27/2023 12:00:00 AM.

## 2022-09-02 NOTE — Telephone Encounter (Signed)
Approved effective 08/27/22-08/27/2023

## 2022-09-26 ENCOUNTER — Other Ambulatory Visit: Payer: Self-pay | Admitting: Family Medicine

## 2022-09-26 DIAGNOSIS — E559 Vitamin D deficiency, unspecified: Secondary | ICD-10-CM

## 2022-11-03 ENCOUNTER — Ambulatory Visit: Payer: BC Managed Care – PPO | Admitting: Family Medicine

## 2022-12-16 ENCOUNTER — Encounter: Payer: Self-pay | Admitting: Family Medicine

## 2022-12-16 ENCOUNTER — Ambulatory Visit: Payer: BC Managed Care – PPO | Admitting: Family Medicine

## 2022-12-16 VITALS — BP 137/85 | HR 54 | Temp 97.2°F | Ht 73.0 in | Wt 218.2 lb

## 2022-12-16 DIAGNOSIS — E785 Hyperlipidemia, unspecified: Secondary | ICD-10-CM

## 2022-12-16 DIAGNOSIS — E291 Testicular hypofunction: Secondary | ICD-10-CM

## 2022-12-16 LAB — URINALYSIS
Bilirubin, UA: NEGATIVE
Glucose, UA: NEGATIVE
Ketones, UA: NEGATIVE
Leukocytes,UA: NEGATIVE
Nitrite, UA: NEGATIVE
Protein,UA: NEGATIVE
RBC, UA: NEGATIVE
Specific Gravity, UA: 1.01 (ref 1.005–1.030)
Urobilinogen, Ur: 0.2 mg/dL (ref 0.2–1.0)
pH, UA: 7 (ref 5.0–7.5)

## 2022-12-16 LAB — LIPID PANEL

## 2022-12-16 MED ORDER — TESTOSTERONE CYPIONATE 200 MG/ML IM SOLN: INTRAMUSCULAR | 1 refills | Status: DC

## 2022-12-16 NOTE — Progress Notes (Signed)
Subjective:  Patient ID: Jacob Morris, male    DOB: 1976/10/04  Age: 47 y.o. MRN: [id number]  CC: Medical Management of Chronic Issues   HPI Jacob Morris presents for follow up of hypogonadism. Due for blood tests. No sx such as edema. Seems to help energy, well -being and muscle tone.   Vitamin D deficient. Taking scrip weekly. No issues with adverse rxn.   On no meds for lipids. Off diet for holidays     12/16/2022    7:53 AM 04/17/2022    9:53 AM 09/23/2021    8:14 AM  Depression screen PHQ 2/9  Decreased Interest 0 0 0  Down, Depressed, Hopeless 0 0 0  PHQ - 2 Score 0 0 0    History Jacob Morris has a past medical history of Hyperlipidemia and Low testosterone.   Jacob Morris has a past surgical history that includes Vasectomy.   His family history includes Asthma in his mother; Heart attack in his maternal grandmother; Heart disease in his maternal grandfather and maternal grandmother; Hypertension in his mother; Other in his father.Jacob Morris reports that Jacob Morris has never smoked. Jacob Morris has never used smokeless tobacco. Jacob Morris reports current alcohol use. Jacob Morris reports that Jacob Morris does not use drugs.    ROS Review of Systems  Constitutional:  Negative for fever.  Respiratory:  Negative for shortness of breath.   Cardiovascular:  Negative for chest pain.  Musculoskeletal:  Negative for arthralgias.  Skin:  Negative for rash.    Objective:  BP 137/85   Pulse (!) 54   Temp (!) 97.2 F (36.2 C)   Ht '6\' 1"'$  (1.854 m)   Wt 218 lb 3.2 oz (99 kg)   SpO2 99%   BMI 28.79 kg/m   BP Readings from Last 3 Encounters:  12/16/22 137/85  05/01/22 137/77  04/17/22 137/85    Wt Readings from Last 3 Encounters:  12/16/22 218 lb 3.2 oz (99 kg)  05/01/22 208 lb 12.8 oz (94.7 kg)  04/17/22 209 lb 6.4 oz (95 kg)     Physical Exam Vitals reviewed.  Constitutional:      Appearance: Jacob Morris is well-developed.  HENT:     Head: Normocephalic and atraumatic.     Right Ear: External ear normal.      Left Ear: External ear normal.     Mouth/Throat:     Pharynx: No oropharyngeal exudate or posterior oropharyngeal erythema.  Eyes:     Pupils: Pupils are equal, round, and reactive to light.  Cardiovascular:     Rate and Rhythm: Normal rate and regular rhythm.     Heart sounds: No murmur heard. Pulmonary:     Effort: No respiratory distress.     Breath sounds: Normal breath sounds.  Musculoskeletal:     Cervical back: Normal range of motion and neck supple.  Neurological:     Mental Status: Jacob Morris is alert and oriented to person, place, and time.       Assessment & Plan:   Jacob Morris was seen today for medical management of chronic issues.  Diagnoses and all orders for this visit:  Hypogonadism male -     Testosterone,Free and Total -     testosterone cypionate (DEPOTESTOSTERONE CYPIONATE) 200 MG/ML injection; INJECT 0.5ML INTO THE      MUSCLE EVERY 10 DAYS.      DISCARD THE REMAINDER -     CBC with Differential/Platelet -     CMP14+EGFR  Dyslipidemia -     Lipid panel -  CBC with Differential/Platelet -     CMP14+EGFR       I am having Jacob Hews "Mickey" maintain his B-D 3CC LUER-LOK SYR 25GX1", Vitamin D (Ergocalciferol), and testosterone cypionate.  Allergies as of 12/16/2022   No Known Allergies      Medication List        Accurate as of December 16, 2022  8:23 AM. If you have any questions, ask your nurse or doctor.          B-D 3CC LUER-LOK SYR 25GX1" 25G X 1" 3 ML Misc Generic drug: SYRINGE-NEEDLE (DISP) 3 ML USE TO INJECT TESTOSTERONE EVERY 10 DAYS   testosterone cypionate 200 MG/ML injection Commonly known as: DEPOTESTOSTERONE CYPIONATE INJECT 0.5ML INTO THE      MUSCLE EVERY 10 DAYS.      DISCARD THE REMAINDER   Vitamin D (Ergocalciferol) 1.25 MG (50000 UNIT) Caps capsule Commonly known as: DRISDOL TAKE 1 CAPSULE EVERY 7 DAYS         Follow-up: Return in about 6 months (around 06/16/2023) for [facility] physical.  [name],  M.D.

## 2022-12-17 NOTE — Progress Notes (Signed)
Hello [name],  Your lab result is normal and/or stable.Some minor variations that are not significant are commonly marked abnormal, but do not represent any medical problem for you.  Best regards, [name], M.D.

## 2022-12-23 LAB — CBC WITH DIFFERENTIAL/PLATELET
Basophils Absolute: 0 10*3/uL (ref 0.0–0.2)
Basos: 1 %
EOS (ABSOLUTE): 0.1 10*3/uL (ref 0.0–0.4)
Eos: 2 %
Hematocrit: 47.2 % (ref 37.5–51.0)
Hemoglobin: 15.9 g/dL (ref 13.0–17.7)
Immature Grans (Abs): 0 10*3/uL (ref 0.0–0.1)
Immature Granulocytes: 0 %
Lymphocytes Absolute: 2.1 10*3/uL (ref 0.7–3.1)
Lymphs: 38 %
MCH: 29.8 pg (ref 26.6–33.0)
MCHC: 33.7 g/dL (ref 31.5–35.7)
MCV: 88 fL (ref 79–97)
Monocytes Absolute: 0.5 10*3/uL (ref 0.1–0.9)
Monocytes: 9 %
Neutrophils Absolute: 2.8 10*3/uL (ref 1.4–7.0)
Neutrophils: 50 %
Platelets: 293 10*3/uL (ref 150–450)
RBC: 5.34 x10E6/uL (ref 4.14–5.80)
RDW: 13 % (ref 11.6–15.4)
WBC: 5.5 10*3/uL (ref 3.4–10.8)

## 2022-12-23 LAB — LIPID PANEL
Chol/HDL Ratio: 3.8 ratio (ref 0.0–5.0)
Cholesterol, Total: 162 mg/dL (ref 100–199)
HDL: 43 mg/dL (ref >39)
LDL Chol Calc (NIH): 106 mg/dL — ABNORMAL HIGH (ref 0–99)
Triglycerides: 68 mg/dL (ref 0–149)
VLDL Cholesterol Cal: 13 mg/dL (ref 5–40)

## 2022-12-23 LAB — CMP14+EGFR
ALT: 25 IU/L (ref 0–44)
AST: 27 IU/L (ref 0–40)
Albumin/Globulin Ratio: 1.9 (ref 1.2–2.2)
Albumin: 4.7 g/dL (ref 4.1–5.1)
Alkaline Phosphatase: 47 IU/L (ref 44–121)
BUN/Creatinine Ratio: 9 (ref 9–20)
BUN: 10 mg/dL (ref 6–24)
Bilirubin Total: 0.5 mg/dL (ref 0.0–1.2)
CO2: 24 mmol/L (ref 20–29)
Calcium: 9.5 mg/dL (ref 8.7–10.2)
Chloride: 95 mmol/L — ABNORMAL LOW (ref 96–106)
Creatinine, Ser: 1.1 mg/dL (ref 0.76–1.27)
Globulin, Total: 2.5 g/dL (ref 1.5–4.5)
Glucose: 100 mg/dL — ABNORMAL HIGH (ref 70–99)
Potassium: 4.5 mmol/L (ref 3.5–5.2)
Sodium: 133 mmol/L — ABNORMAL LOW (ref 134–144)
Total Protein: 7.2 g/dL (ref 6.0–8.5)
eGFR: 83 mL/min/{1.73_m2} (ref >59)

## 2022-12-23 LAB — TESTOSTERONE,FREE AND TOTAL
Testosterone, Free: 8.7 pg/mL (ref 6.8–21.5)
Testosterone: 535 ng/dL (ref 264–916)

## 2023-01-05 DIAGNOSIS — D239 Other benign neoplasm of skin, unspecified: Secondary | ICD-10-CM | POA: Diagnosis not present

## 2023-03-08 ENCOUNTER — Other Ambulatory Visit: Payer: Self-pay | Admitting: Family Medicine

## 2023-03-08 DIAGNOSIS — E559 Vitamin D deficiency, unspecified: Secondary | ICD-10-CM

## 2023-04-16 ENCOUNTER — Telehealth: Payer: Self-pay | Admitting: Family Medicine

## 2023-04-16 NOTE — Telephone Encounter (Signed)
Called patient, no answer Left detailed voicemail advising best resource for this would be the health department, if he has already spoken with them and just needs to compare what he has had vs what he needs to give [place] a call back.

## 2023-04-16 NOTE — Telephone Encounter (Signed)
Patient is going to [place] and wants to discuss his immunizations to find out which ones he needs. Please call back at [phone]

## 2023-04-16 NOTE — Telephone Encounter (Signed)
*  Sent to wrong pool on first message

## 2023-04-17 NOTE — Telephone Encounter (Signed)
[facility] in [place] has eperts in travel medicine. I recommend you contact them.

## 2023-04-17 NOTE — Telephone Encounter (Signed)
I spoke to pt and advised according to our vaccine record and [facility] he has only had flu, TDAP and COVID vaccines. Advised pt he could possibly have some labs drawn for immunity titers and then he asked if Dr Darlyn Read would prescribe the anti malaria meds for him so I advised he may ntbs in office and pt voiced understanding. Do you want pt to be seen in office with you to order labs and discuss the meds?

## 2023-04-17 NOTE — Telephone Encounter (Signed)
Pt spoke to health dept. And was told he needs chicken pox, Dtp, MMR, Polio, typhoid and shingles  They also recommended Hep A&B, chollera, and rabies if he is considered at risk. Pt wants to know which ones he has had of the ones that were recommended. It is also recommended he takes anti malaria medication before and after he goes to [place]

## 2023-04-20 NOTE — Telephone Encounter (Signed)
PATIENT AWARE AND AGREEABLE 

## 2023-05-11 ENCOUNTER — Telehealth: Payer: Self-pay | Admitting: Family Medicine

## 2023-05-11 NOTE — Telephone Encounter (Signed)
Pt. Needs to be seen by me for this. Thanks, WS

## 2023-05-11 NOTE — Telephone Encounter (Signed)
How many of these can be given at once? Does he need to see you or triage nurse?

## 2023-05-12 NOTE — Telephone Encounter (Signed)
Left detailed message for patient to call back to schedule an appt with [facility].

## 2023-05-13 ENCOUNTER — Ambulatory Visit: Payer: BC Managed Care – PPO | Admitting: Family Medicine

## 2023-05-13 ENCOUNTER — Encounter: Payer: Self-pay | Admitting: Family Medicine

## 2023-05-13 VITALS — BP 134/79 | HR 67 | Temp 98.0°F | Ht 73.0 in | Wt 218.2 lb

## 2023-05-13 DIAGNOSIS — Z7184 Encounter for health counseling related to travel: Secondary | ICD-10-CM | POA: Diagnosis not present

## 2023-05-13 DIAGNOSIS — Z23 Encounter for immunization: Secondary | ICD-10-CM

## 2023-05-13 MED ORDER — CIPROFLOXACIN HCL 500 MG PO TABS: 500.0000 mg | ORAL_TABLET | Freq: Two times a day (BID) | ORAL | 0 refills | Status: DC

## 2023-05-13 NOTE — Progress Notes (Signed)
Subjective:  Patient ID: Jacob Morris, male    DOB: [birth year]  Age: 47 y.o. MRN: [id number]  CC: Immunizations   HPI Jacob Morris presents for travel medicine consult. Needs MMR, TDaP and Twinrix due to [place]comin [place]. He is going to [place] in [place]. Saw [facility] and was given malaria prophylaxis and Typhoid injection. He needs med for travelers diarrhea prescribed here.      05/13/2023    2:10 PM 12/16/2022    7:53 AM 04/17/2022    9:53 AM  Depression screen PHQ 2/9  Decreased Interest 0 0 0  Down, Depressed, Hopeless 0 0 0  PHQ - 2 Score 0 0 0    History Jacob Morris has a past medical history of Hyperlipidemia and Low testosterone.   He has a past surgical history that includes Vasectomy.   His family history includes Asthma in his mother; Heart attack in his maternal grandmother; Heart disease in his maternal grandfather and maternal grandmother; Hypertension in his mother; Other in his father.He reports that he has never smoked. He has never used smokeless tobacco. He reports current alcohol use. He reports that he does not use drugs.    ROS Review of Systems  Constitutional:  Negative for fever.  Respiratory:  Negative for shortness of breath.   Cardiovascular:  Negative for chest pain.  Musculoskeletal:  Negative for arthralgias.  Skin:  Negative for rash.    Objective:  BP 134/79   Pulse 67   Temp 98 F (36.7 C)   Ht 6\' 1"  (1.854 m)   Wt 218 lb 3.2 oz (99 kg)   SpO2 97%   BMI 28.79 kg/m   BP Readings from Last 3 Encounters:  05/13/23 134/79  12/16/22 137/85  05/01/22 137/77    Wt Readings from Last 3 Encounters:  05/13/23 218 lb 3.2 oz (99 kg)  12/16/22 218 lb 3.2 oz (99 kg)  05/01/22 208 lb 12.8 oz (94.7 kg)     Physical Exam Vitals reviewed.  Constitutional:      Appearance: He is well-developed.  HENT:     Head: Normocephalic and atraumatic.     Right Ear: External ear normal.     Left Ear: External ear normal.      Mouth/Throat:     Pharynx: No oropharyngeal exudate or posterior oropharyngeal erythema.  Eyes:     Pupils: Pupils are equal, round, and reactive to light.  Cardiovascular:     Rate and Rhythm: Normal rate and regular rhythm.     Heart sounds: No murmur heard. Pulmonary:     Effort: No respiratory distress.     Breath sounds: Normal breath sounds.  Musculoskeletal:     Cervical back: Normal range of motion and neck supple.  Neurological:     Mental Status: He is alert and oriented to person, place, and time.       Assessment & Plan:   Jacob Morris was seen today for immunizations.  Diagnoses and all orders for this visit:  Travel advice encounter  Need for MMRV (measles-mumps-rubella-varicella) vaccine -     MMR and varicella combined vaccine subcutaneous  Need for Tdap vaccination -     Tdap vaccine greater than or equal to 7yo IM  Need for vaccination with Twinrix -     Hepatitis A hepatitis B combined vaccine IM  Other orders -     ciprofloxacin (CIPRO) 500 MG tablet; Take 1 tablet (500 mg total) by mouth 2 (two) times daily.  I am having Jacob Nash "Mickey" start on ciprofloxacin. I am also having him maintain his B-D 3CC LUER-LOK SYR 25GX1", testosterone cypionate, and Vitamin D (Ergocalciferol).  Allergies as of 05/13/2023   No Known Allergies      Medication List        Accurate as of May 13, 2023 10:03 PM. If you have any questions, ask your nurse or doctor.          B-D 3CC LUER-LOK SYR 25GX1" 25G X 1" 3 ML Misc Generic drug: SYRINGE-NEEDLE (DISP) 3 ML USE TO INJECT TESTOSTERONE EVERY 10 DAYS   ciprofloxacin 500 MG tablet Commonly known as: Cipro Take 1 tablet (500 mg total) by mouth 2 (two) times daily. Started by: Mechele Claude, MD   testosterone cypionate 200 MG/ML injection Commonly known as: DEPOTESTOSTERONE CYPIONATE INJECT 0.5ML INTO THE      MUSCLE EVERY 10 DAYS.      DISCARD THE REMAINDER   Vitamin D  (Ergocalciferol) 1.25 MG (50000 UNIT) Caps capsule Commonly known as: DRISDOL TAKE 1 CAPSULE EVERY 7 DAYS         Follow-up: Return in about 1 month (around 06/12/2023) for Compete physical.  Mechele Claude, M.D.

## 2023-06-10 ENCOUNTER — Ambulatory Visit: Payer: BC Managed Care – PPO

## 2023-06-16 ENCOUNTER — Ambulatory Visit (INDEPENDENT_AMBULATORY_CARE_PROVIDER_SITE_OTHER): Payer: BC Managed Care – PPO | Admitting: Family Medicine

## 2023-06-16 ENCOUNTER — Encounter: Payer: Self-pay | Admitting: Family Medicine

## 2023-06-16 VITALS — BP 141/87 | HR 65 | Temp 97.6°F | Ht 73.0 in | Wt 222.2 lb

## 2023-06-16 DIAGNOSIS — Z0000 Encounter for general adult medical examination without abnormal findings: Secondary | ICD-10-CM | POA: Diagnosis not present

## 2023-06-16 DIAGNOSIS — Z0001 Encounter for general adult medical examination with abnormal findings: Secondary | ICD-10-CM

## 2023-06-16 DIAGNOSIS — E785 Hyperlipidemia, unspecified: Secondary | ICD-10-CM

## 2023-06-16 DIAGNOSIS — E559 Vitamin D deficiency, unspecified: Secondary | ICD-10-CM

## 2023-06-16 DIAGNOSIS — Z23 Encounter for immunization: Secondary | ICD-10-CM

## 2023-06-16 DIAGNOSIS — E291 Testicular hypofunction: Secondary | ICD-10-CM

## 2023-06-16 DIAGNOSIS — R1013 Epigastric pain: Secondary | ICD-10-CM

## 2023-06-16 LAB — URINALYSIS
Bilirubin, UA: NEGATIVE
Glucose, UA: NEGATIVE
Ketones, UA: NEGATIVE
Leukocytes,UA: NEGATIVE
Nitrite, UA: NEGATIVE
Protein,UA: NEGATIVE
RBC, UA: NEGATIVE
Specific Gravity, UA: 1.01 (ref 1.005–1.030)
Urobilinogen, Ur: 0.2 mg/dL (ref 0.2–1.0)
pH, UA: 7 (ref 5.0–7.5)

## 2023-06-16 LAB — CMP14+EGFR
ALT: 28 IU/L (ref 0–44)
AST: 27 IU/L (ref 0–40)
BUN/Creatinine Ratio: 8 — ABNORMAL LOW (ref 9–20)
BUN: 9 mg/dL (ref 6–24)
Bilirubin Total: 0.7 mg/dL (ref 0.0–1.2)
CO2: 24 mmol/L (ref 20–29)
Calcium: 9.7 mg/dL (ref 8.7–10.2)
Chloride: 95 mmol/L — ABNORMAL LOW (ref 96–106)
Glucose: 102 mg/dL — ABNORMAL HIGH (ref 70–99)
Potassium: 4.9 mmol/L (ref 3.5–5.2)
Total Protein: 7.3 g/dL (ref 6.0–8.5)
eGFR: 87 mL/min/{1.73_m2} (ref >59)

## 2023-06-16 LAB — CBC WITH DIFFERENTIAL/PLATELET
Basophils Absolute: 0 10*3/uL (ref 0.0–0.2)
Eos: 3 %
Hematocrit: 48.1 % (ref 37.5–51.0)
Immature Grans (Abs): 0 10*3/uL (ref 0.0–0.1)
Immature Granulocytes: 0 %
Lymphocytes Absolute: 2.1 10*3/uL (ref 0.7–3.1)
Lymphs: 35 %
MCH: 29.8 pg (ref 26.6–33.0)
MCHC: 33.5 g/dL (ref 31.5–35.7)
Monocytes Absolute: 0.6 10*3/uL (ref 0.1–0.9)
Monocytes: 10 %
Platelets: 234 10*3/uL (ref 150–450)
RBC: 5.41 x10E6/uL (ref 4.14–5.80)
RDW: 13.9 % (ref 11.6–15.4)
WBC: 6 10*3/uL (ref 3.4–10.8)

## 2023-06-16 LAB — LIPID PANEL

## 2023-06-16 LAB — TESTOSTERONE,FREE AND TOTAL

## 2023-06-16 LAB — VITAMIN D 25 HYDROXY (VIT D DEFICIENCY, FRACTURES)

## 2023-06-16 MED ORDER — PANTOPRAZOLE SODIUM 40 MG PO TBEC
40.0000 mg | DELAYED_RELEASE_TABLET | Freq: Every day | ORAL | 1 refills | Status: DC
Start: 2023-06-16 — End: 2024-02-06

## 2023-06-16 NOTE — Progress Notes (Signed)
Subjective:  Patient ID: Jacob Morris, male    DOB: 1976/10/19  Age: 47 y.o. MRN: [id number]  CC: Medical Management of Chronic Issues   HPI Jacob Morris presents for CPE. Also due recheck for cholesterol and testosterone replacement.Upcoming trip to [place] to [place]. Needs  MMRV and Hep B and Hep A today. Had Typhoid vaccine elsewhere.  Vinegar causes throat to burn when ingested.  Feels it at upper 1/2 of esophagus.     06/16/2023    8:07 AM 05/13/2023    2:10 PM 12/16/2022    7:53 AM  Depression screen PHQ 2/9  Decreased Interest 0 0 0  Down, Depressed, Hopeless 0 0 0  PHQ - 2 Score 0 0 0    History Jacob Morris has a past medical history of Hyperlipidemia and Low testosterone.   He has a past surgical history that includes Vasectomy.   His family history includes Asthma in his mother; Heart attack in his maternal grandmother; Heart disease in his maternal grandfather and maternal grandmother; Hypertension in his mother; Other in his father.He reports that he has never smoked. He has never used smokeless tobacco. He reports current alcohol use. He reports that he does not use drugs.    ROS Review of Systems  Constitutional:  Negative for activity change, fatigue and unexpected weight change.  HENT:  Positive for trouble swallowing (dysphagia for acidic foods.). Negative for congestion, ear pain, hearing loss and postnasal drip.   Eyes:  Negative for pain and visual disturbance.  Respiratory:  Negative for cough, chest tightness and shortness of breath.   Cardiovascular:  Negative for chest pain, palpitations and leg swelling.  Gastrointestinal:  Negative for abdominal distention, abdominal pain, blood in stool, constipation, diarrhea, nausea and vomiting.  Endocrine: Negative for cold intolerance, heat intolerance and polydipsia.  Genitourinary:  Negative for difficulty urinating, dysuria, flank pain, frequency and urgency.  Musculoskeletal:  Negative for  arthralgias and joint swelling.  Skin:  Negative for color change, rash and wound.  Allergic/Immunologic: Negative for food allergies.  Neurological:  Negative for dizziness, syncope, speech difficulty, weakness, light-headedness, numbness and headaches.  Hematological:  Does not bruise/bleed easily.  Psychiatric/Behavioral:  Negative for confusion, decreased concentration, dysphoric mood and sleep disturbance. The patient is not nervous/anxious.     Objective:  BP (!) 141/87   Pulse 65   Temp 97.6 F (36.4 C)   Ht 6\' 1"  (1.854 m)   Wt 222 lb 3.2 oz (100.8 kg)   SpO2 96%   BMI 29.32 kg/m   BP Readings from Last 3 Encounters:  06/16/23 (!) 141/87  05/13/23 134/79  12/16/22 137/85    Wt Readings from Last 3 Encounters:  06/16/23 222 lb 3.2 oz (100.8 kg)  05/13/23 218 lb 3.2 oz (99 kg)  12/16/22 218 lb 3.2 oz (99 kg)     Physical Exam Constitutional:      Appearance: He is well-developed.  HENT:     Head: Normocephalic and atraumatic.  Eyes:     Pupils: Pupils are equal, round, and reactive to light.  Neck:     Thyroid: No thyromegaly.     Trachea: No tracheal deviation.  Cardiovascular:     Rate and Rhythm: Normal rate and regular rhythm.     Heart sounds: Normal heart sounds. No murmur heard.    No friction rub. No gallop.  Pulmonary:     Breath sounds: Normal breath sounds. No wheezing or rales.  Abdominal:  General: Bowel sounds are normal. There is no distension.     Palpations: Abdomen is soft. There is no mass.     Tenderness: There is no abdominal tenderness.     Hernia: There is no hernia in the left inguinal area.  Genitourinary:    Penis: Normal.      Testes: Normal.  Musculoskeletal:        General: Normal range of motion.     Cervical back: Normal range of motion.  Lymphadenopathy:     Cervical: No cervical adenopathy.  Skin:    General: Skin is warm and dry.  Neurological:     Mental Status: He is alert and oriented to person, place, and  time.       Assessment & Plan:   Jacob Morris was seen today for medical management of chronic issues.  Diagnoses and all orders for this visit:  Well adult exam -     CBC with Differential/Platelet -     CMP14+EGFR -     Urinalysis  Dyslipidemia -     Lipid panel  Vitamin D deficiency -     VITAMIN D 25 Hydroxy (Vit-D Deficiency, Fractures)  Need for hepatitis B vaccination -     Cancel: Hepatitis B vaccine adult IM -     Heplisav-B (HepB-CPG) Vaccine  Hypogonadism male -     Testosterone,Free and Total  Need for MMRV (measles-mumps-rubella-varicella) vaccine -     MMR and varicella combined vaccine subcutaneous  Need for hepatitis A immunization -     Hepatitis A vaccine adult IM  Dyspepsia -     pantoprazole (PROTONIX) 40 MG tablet; Take 1 tablet (40 mg total) by mouth daily. For stomach       I have discontinued Amado Nash "Mickey"'s ciprofloxacin. I am also having him start on pantoprazole. Additionally, I am having him maintain his B-D 3CC LUER-LOK SYR 25GX1", testosterone cypionate, and Vitamin D (Ergocalciferol).  Allergies as of 06/16/2023   No Known Allergies      Medication List        Accurate as of June 16, 2023  6:12 PM. If you have any questions, ask your nurse or doctor.          STOP taking these medications    ciprofloxacin 500 MG tablet Commonly known as: Cipro Stopped by: Mechele Claude, MD       TAKE these medications    B-D 3CC LUER-LOK SYR 25GX1" 25G X 1" 3 ML Misc Generic drug: SYRINGE-NEEDLE (DISP) 3 ML USE TO INJECT TESTOSTERONE EVERY 10 DAYS   pantoprazole 40 MG tablet Commonly known as: PROTONIX Take 1 tablet (40 mg total) by mouth daily. For stomach Started by: Mechele Claude, MD   testosterone cypionate 200 MG/ML injection Commonly known as: DEPOTESTOSTERONE CYPIONATE INJECT 0.5ML INTO THE      MUSCLE EVERY 10 DAYS.      DISCARD THE REMAINDER   Vitamin D (Ergocalciferol) 1.25 MG (50000 UNIT) Caps  capsule Commonly known as: DRISDOL TAKE 1 CAPSULE EVERY 7 DAYS         Follow-up: Return in about 6 months (around 12/17/2023).  Mechele Claude, M.D.

## 2023-06-17 LAB — CMP14+EGFR
Albumin: 4.6 g/dL (ref 4.1–5.1)
Alkaline Phosphatase: 50 IU/L (ref 44–121)
Creatinine, Ser: 1.06 mg/dL (ref 0.76–1.27)
Globulin, Total: 2.7 g/dL (ref 1.5–4.5)
Sodium: 133 mmol/L — ABNORMAL LOW (ref 134–144)

## 2023-06-17 LAB — LIPID PANEL
LDL Chol Calc (NIH): 140 mg/dL — ABNORMAL HIGH (ref 0–99)
Triglycerides: 145 mg/dL (ref 0–149)
VLDL Cholesterol Cal: 26 mg/dL (ref 5–40)

## 2023-06-17 LAB — CBC WITH DIFFERENTIAL/PLATELET
Basos: 1 %
EOS (ABSOLUTE): 0.2 10*3/uL (ref 0.0–0.4)
Hemoglobin: 16.1 g/dL (ref 13.0–17.7)
MCV: 89 fL (ref 79–97)
Neutrophils Absolute: 3.1 10*3/uL (ref 1.4–7.0)
Neutrophils: 51 %

## 2023-06-17 LAB — TESTOSTERONE,FREE AND TOTAL: Testosterone, Free: 18.2 pg/mL (ref 6.8–21.5)

## 2023-06-24 ENCOUNTER — Other Ambulatory Visit: Payer: Self-pay | Admitting: Family Medicine

## 2023-06-24 DIAGNOSIS — E559 Vitamin D deficiency, unspecified: Secondary | ICD-10-CM

## 2023-09-11 ENCOUNTER — Telehealth: Payer: Self-pay | Admitting: Family Medicine

## 2023-09-11 NOTE — Telephone Encounter (Signed)
  Prescription Request  09/11/2023  Is this a "Controlled Substance" medicine? yes  Have you seen your PCP in the last 2 weeks? No pt was seen in July by Dr. Darlyn Read says that it was not refilled then next appt is scheduled for Jan 2025  If YES, route message to pool  -  If NO, patient needs to be scheduled for appointment.  What is the name of the medication or equipment? testosterone cypionate (DEPOTESTOSTERONE CYPIONATE) 200 MG/ML injection   Have you contacted your pharmacy to request a refill? yes   Which pharmacy would you like this sent to? [facility] [place], [place] - [address]  [address] [place], [place] [address]    Patient notified that their request is being sent to the clinical staff for review and that they should receive a response within 2 business days.

## 2023-09-14 ENCOUNTER — Other Ambulatory Visit: Payer: Self-pay | Admitting: Family Medicine

## 2023-09-14 DIAGNOSIS — E291 Testicular hypofunction: Secondary | ICD-10-CM

## 2023-09-14 MED ORDER — TESTOSTERONE CYPIONATE 200 MG/ML IM SOLN
INTRAMUSCULAR | 1 refills | Status: DC
Start: 2023-09-14 — End: 2024-02-04

## 2023-09-14 NOTE — Telephone Encounter (Signed)
Please let the patient know that I sent their prescription to their pharmacy. Thanks, WS 

## 2023-09-18 ENCOUNTER — Telehealth: Payer: Self-pay | Admitting: Family Medicine

## 2023-09-18 ENCOUNTER — Other Ambulatory Visit (HOSPITAL_COMMUNITY): Payer: Self-pay

## 2023-09-21 ENCOUNTER — Telehealth: Payer: Self-pay

## 2023-09-21 ENCOUNTER — Other Ambulatory Visit (HOSPITAL_COMMUNITY): Payer: Self-pay

## 2023-09-21 NOTE — Telephone Encounter (Signed)
Pharmacy Patient Advocate Encounter   Received notification from Pt Calls Messages that prior authorization for Testosterone Cypionate 200MG /ML intramuscular solution is required/requested.   Insurance verification completed.   The patient is insured through [employer] .   Per test claim: CANCELLED due to A new prior authorization (PA) request cannot be started at this time because there is a request already open for this drug.  Called insurance to check status on PA. Additional information has been requested from the patient's insurance in order to proceed with the prior authorization request. Requested information has been sent, or form has been filled out and faxed back to [phone]

## 2023-09-21 NOTE — Telephone Encounter (Signed)
Initiated PA via CMM. PA CANCELLED due to: A new prior authorization (PA) request cannot be started at this time because there is a request already open for this drug.   Called insurance to check status on PA. Additional information has been requested from the patient's insurance in order to proceed with the prior authorization request. Requested information has been sent, or form has been filled out and faxed back to [phone]

## 2023-09-24 ENCOUNTER — Other Ambulatory Visit (HOSPITAL_COMMUNITY): Payer: Self-pay

## 2023-09-24 NOTE — Telephone Encounter (Signed)
Pharmacy Patient Advocate Encounter  Received notification from [facility] that Prior Authorization for Testosterone Cypionate 200MG /ML intramuscular solution  has been DENIED.  Full denial letter will be uploaded to the media tab. See denial reason below.   PA #/Case ID/Reference #: [id number]       Please be advised we currently do not have a Pharmacist to review denials, therefore you will need to process appeals accordingly as needed. Thanks for your support at this time. Contact for appeals are as follows: Phone: [phone], Fax: [phone]

## 2023-10-01 NOTE — Telephone Encounter (Signed)
Patient got letter from [facility] regarding the PA. York Spaniel it was denied due to having some type of cancer and that is not true. DOES NOT HAVE cancer. States [facility] was copied in the letter and wants the nurse to call him. He is down to three valves. Please call.

## 2023-10-01 NOTE — Telephone Encounter (Signed)
Patient calls and states he talked to [facility] and they are going to resubmit PA. Should be sending by fax.

## 2023-10-01 NOTE — Telephone Encounter (Signed)
Testosterone labs that was sent to insurance also included Hematocrit levels. Those levels were greater than 48 which shows polycythemia. Called insurance to find out next steps. Representative stated that an appeal is required that states that patient does not have any other illness. Per representative, Appeal can be done at phone number [phone].  Please be advised we currently do not have a Pharmacist to review denials, therefore you will need to process appeals accordingly as needed. Thanks for your support at this time. Contact for appeals are as follows: Phone: [phone], Fax: [phone]

## 2023-10-05 NOTE — Telephone Encounter (Signed)
Pt spoke with insurance company and was told that they need a letter from Dr Darlyn Read stating that patient does NOT have this illness. Pt is very frustrated because he has been trying to get this Rx approved since July. Says he has been taking it for 15 years and has never had an issue until now all of a sudden. Pt requesting this be done ASAP.

## 2023-10-06 NOTE — Telephone Encounter (Signed)
Okay to write letter. He does not have polycythemia. Note that the Hb is in normal range as part of the letter.

## 2023-10-08 NOTE — Telephone Encounter (Signed)
Letter was made sent to pt via Mychart and faxed to insurance for appeal. Pt aware and verbalized understanding

## 2023-10-27 ENCOUNTER — Other Ambulatory Visit (HOSPITAL_COMMUNITY): Payer: Self-pay

## 2023-10-27 NOTE — Telephone Encounter (Signed)
Pharmacy Patient Advocate Encounter  Received notification from [facility] that Prior Authorization for TESTOSTERONE has been APPROVED from 10/27/23 to 10/26/24. Ran test claim, Copay is $15. This test claim was processed through [facility]- copay amounts may vary at other pharmacies due to pharmacy/plan contracts, or as the patient moves through the different stages of their insurance plan.   PA #/Case ID/Reference #: [id number]

## 2023-10-27 NOTE — Telephone Encounter (Signed)
Called insurance at  [phone]  to answer clinical questions.

## 2023-10-27 NOTE — Telephone Encounter (Signed)
Case # [id number] for PA on testosterone appeal, clinical questions that need to be answered.

## 2023-10-28 ENCOUNTER — Other Ambulatory Visit: Payer: Self-pay | Admitting: Family Medicine

## 2023-10-28 DIAGNOSIS — E559 Vitamin D deficiency, unspecified: Secondary | ICD-10-CM

## 2023-11-16 ENCOUNTER — Ambulatory Visit (INDEPENDENT_AMBULATORY_CARE_PROVIDER_SITE_OTHER): Payer: BC Managed Care – PPO

## 2023-11-16 ENCOUNTER — Ambulatory Visit: Payer: BC Managed Care – PPO | Admitting: Family Medicine

## 2023-11-16 ENCOUNTER — Encounter: Payer: Self-pay | Admitting: Family Medicine

## 2023-11-16 VITALS — BP 160/95 | HR 57 | Temp 97.4°F | Ht 73.0 in | Wt 223.0 lb

## 2023-11-16 DIAGNOSIS — M7521 Bicipital tendinitis, right shoulder: Secondary | ICD-10-CM

## 2023-11-16 DIAGNOSIS — M25511 Pain in right shoulder: Secondary | ICD-10-CM | POA: Diagnosis not present

## 2023-11-16 MED ORDER — DICLOFENAC SODIUM 75 MG PO TBEC: 75.0000 mg | DELAYED_RELEASE_TABLET | Freq: Two times a day (BID) | ORAL | 0 refills | Status: DC

## 2023-11-16 MED ORDER — BETAMETHASONE SOD PHOS & ACET 6 (3-3) MG/ML IJ SUSP
6.0000 mg | Freq: Once | INTRAMUSCULAR | Status: AC
Start: 2023-11-16 — End: 2023-11-16
  Administered 2023-11-16: 6 mg via INTRAMUSCULAR

## 2023-11-16 NOTE — Progress Notes (Signed)
Subjective:  Patient ID: Jacob Morris, male    DOB: 09-22-1976  Age: 47 y.o. MRN: [id number]  CC: Shoulder Pain   HPI Jacob Morris presents for pain at right shoulder.     11/16/2023   11:29 AM 11/16/2023   11:15 AM 06/16/2023    8:07 AM  Depression screen PHQ 2/9  Decreased Interest 1 0 0  Down, Depressed, Hopeless 1 0 0  PHQ - 2 Score 2 0 0  Altered sleeping 1    Tired, decreased energy 1    Change in appetite 0    Feeling bad or failure about yourself  1    Trouble concentrating 0    Moving slowly or fidgety/restless 0    Suicidal thoughts 0    PHQ-9 Score 5    Difficult doing work/chores Not difficult at all      History Jacob Morris has a past medical history of Hyperlipidemia and Low testosterone.   Jacob Morris has a past surgical history that includes Vasectomy.   His family history includes Asthma in his mother; Heart attack in his maternal grandmother; Heart disease in his maternal grandfather and maternal grandmother; Hypertension in his mother; Other in his father.Jacob Morris reports that Jacob Morris has never smoked. Jacob Morris has never used smokeless tobacco. Jacob Morris reports current alcohol use. Jacob Morris reports that Jacob Morris does not use drugs.    ROS Review of Systems  Objective:  BP (!) 160/95   Pulse (!) 57   Temp (!) 97.4 F (36.3 C)   Ht 6\' 1"  (1.854 m)   Wt 223 lb (101.2 kg)   SpO2 97%   BMI 29.42 kg/m   BP Readings from Last 3 Encounters:  11/16/23 (!) 160/95  06/16/23 (!) 141/87  05/13/23 134/79    Wt Readings from Last 3 Encounters:  11/16/23 223 lb (101.2 kg)  06/16/23 222 lb 3.2 oz (100.8 kg)  05/13/23 218 lb 3.2 oz (99 kg)     Physical Exam Constitutional:      General: Jacob Morris is not in acute distress.    Appearance: Jacob Morris is not ill-appearing.  HENT:     Head: Normocephalic and atraumatic.  Cardiovascular:     Rate and Rhythm: Normal rate and regular rhythm.  Pulmonary:     Effort: Pulmonary effort is normal.     Breath sounds: Normal breath sounds.   Neurological:     Mental Status: Jacob Morris is alert.    XR, right shoulder: no abnormality noted   Assessment & Plan:   There are no diagnoses linked to this encounter.     I am having Jacob Nash "Mickey" maintain his B-D 3CC LUER-LOK SYR 25GX1", pantoprazole, testosterone cypionate, and Vitamin D (Ergocalciferol).  Allergies as of 11/16/2023   No Known Allergies      Medication List        Accurate as of November 16, 2023 11:44 AM. If you have any questions, ask your nurse or doctor.          B-D 3CC LUER-LOK SYR 25GX1" 25G X 1" 3 ML Misc Generic drug: SYRINGE-NEEDLE (DISP) 3 ML USE TO INJECT TESTOSTERONE EVERY 10 DAYS   pantoprazole 40 MG tablet Commonly known as: PROTONIX Take 1 tablet (40 mg total) by mouth daily. For stomach   testosterone cypionate 200 MG/ML injection Commonly known as: DEPOTESTOSTERONE CYPIONATE INJECT 0.5ML INTO THE      MUSCLE EVERY 10 DAYS.      DISCARD THE REMAINDER   Vitamin D (Ergocalciferol) 1.25 MG (  50000 UNIT) Caps capsule Commonly known as: DRISDOL TAKE 1 CAPSULE BY MOUTH EVERY 7 DAYS         Follow-up: No follow-ups on file.  Mechele Claude, M.D.

## 2023-11-26 ENCOUNTER — Encounter: Payer: Self-pay | Admitting: Family Medicine

## 2023-12-13 ENCOUNTER — Other Ambulatory Visit: Payer: Self-pay | Admitting: Family Medicine

## 2023-12-16 ENCOUNTER — Ambulatory Visit: Payer: BC Managed Care – PPO | Admitting: Family Medicine

## 2024-01-05 DIAGNOSIS — I781 Nevus, non-neoplastic: Secondary | ICD-10-CM | POA: Diagnosis not present

## 2024-01-07 DIAGNOSIS — M7541 Impingement syndrome of right shoulder: Secondary | ICD-10-CM | POA: Diagnosis not present

## 2024-01-14 ENCOUNTER — Encounter: Payer: BC Managed Care – PPO | Admitting: Family Medicine

## 2024-01-15 DIAGNOSIS — M7541 Impingement syndrome of right shoulder: Secondary | ICD-10-CM | POA: Diagnosis not present

## 2024-01-26 DIAGNOSIS — M7541 Impingement syndrome of right shoulder: Secondary | ICD-10-CM | POA: Diagnosis not present

## 2024-02-01 DIAGNOSIS — M7541 Impingement syndrome of right shoulder: Secondary | ICD-10-CM | POA: Diagnosis not present

## 2024-02-04 ENCOUNTER — Telehealth: Payer: Self-pay | Admitting: Family Medicine

## 2024-02-04 ENCOUNTER — Ambulatory Visit: Payer: BC Managed Care – PPO | Admitting: Family Medicine

## 2024-02-04 ENCOUNTER — Encounter: Payer: Self-pay | Admitting: Family Medicine

## 2024-02-04 VITALS — BP 141/87 | HR 60 | Temp 97.6°F | Ht 73.0 in | Wt 220.0 lb

## 2024-02-04 DIAGNOSIS — Z125 Encounter for screening for malignant neoplasm of prostate: Secondary | ICD-10-CM

## 2024-02-04 DIAGNOSIS — Z23 Encounter for immunization: Secondary | ICD-10-CM | POA: Diagnosis not present

## 2024-02-04 DIAGNOSIS — E291 Testicular hypofunction: Secondary | ICD-10-CM | POA: Diagnosis not present

## 2024-02-04 DIAGNOSIS — E785 Hyperlipidemia, unspecified: Secondary | ICD-10-CM | POA: Diagnosis not present

## 2024-02-04 DIAGNOSIS — E559 Vitamin D deficiency, unspecified: Secondary | ICD-10-CM | POA: Diagnosis not present

## 2024-02-04 MED ORDER — TESTOSTERONE CYPIONATE 200 MG/ML IM SOLN: INTRAMUSCULAR | 1 refills | Status: DC

## 2024-02-04 MED ORDER — VITAMIN D (ERGOCALCIFEROL) 1.25 MG (50000 UNIT) PO CAPS: ORAL_CAPSULE | ORAL | 1 refills | Status: DC

## 2024-02-04 NOTE — Telephone Encounter (Signed)
 Lab orders already placed in chart

## 2024-02-04 NOTE — Progress Notes (Signed)
 Subjective:  Patient ID: Jacob Morris, male    DOB: 18-May-1976  Age: 48 y.o. MRN: [id number]  CC: Medical Management of Chronic Issues (6 month)   HPI Claire Bridge presents for patient in for follow-up of elevated cholesterol.  He currently is not medicated for this problem.  Levels to be checked today. Currently no chest pain, shortness of breath or other cardiovascular related symptoms noted.  Follow up for testosterone deficiency: Pt. Using medication as directed. Denies any sx referrable to DVT such as edema or erythema of legs. No dyspnea or chest pain. Energy level reported as being good. Libido is normal and denies E.D. Feels strength is adequate and improved from baseline.  Patient continues to take prescription strength vitamin D due to previously detected deficiency.  That will be rechecked today with his lab draw as well.     11/16/2023   11:29 AM 11/16/2023   11:15 AM 06/16/2023    8:07 AM  Depression screen PHQ 2/9  Decreased Interest 1 0 0  Down, Depressed, Hopeless 1 0 0  PHQ - 2 Score 2 0 0  Altered sleeping 1    Tired, decreased energy 1    Change in appetite 0    Feeling bad or failure about yourself  1    Trouble concentrating 0    Moving slowly or fidgety/restless 0    Suicidal thoughts 0    PHQ-9 Score 5    Difficult doing work/chores Not difficult at all      History Holston has a past medical history of Hyperlipidemia and Low testosterone.   He has a past surgical history that includes Vasectomy.   His family history includes Asthma in his mother; Heart attack in his maternal grandmother; Heart disease in his maternal grandfather and maternal grandmother; Hypertension in his mother; Other in his father.He reports that he has never smoked. He has never used smokeless tobacco. He reports current alcohol use. He reports that he does not use drugs.    ROS Review of Systems  Constitutional:  Negative for fever.  Respiratory:  Negative for  shortness of breath.   Cardiovascular:  Negative for chest pain.  Musculoskeletal:  Negative for arthralgias.  Skin:  Negative for rash.    Objective:  BP (!) 141/87   Pulse 60   Temp 97.6 F (36.4 C)   Ht 6\' 1"  (1.854 m)   Wt 220 lb (99.8 kg)   SpO2 94%   BMI 29.03 kg/m   BP Readings from Last 3 Encounters:  02/04/24 (!) 141/87  11/16/23 (!) 160/95  06/16/23 (!) 141/87    Wt Readings from Last 3 Encounters:  02/04/24 220 lb (99.8 kg)  11/16/23 223 lb (101.2 kg)  06/16/23 222 lb 3.2 oz (100.8 kg)     Physical Exam Vitals reviewed.  Constitutional:      Appearance: He is well-developed.  HENT:     Head: Normocephalic and atraumatic.     Right Ear: External ear normal.     Left Ear: External ear normal.     Mouth/Throat:     Pharynx: No oropharyngeal exudate or posterior oropharyngeal erythema.  Eyes:     Pupils: Pupils are equal, round, and reactive to light.  Cardiovascular:     Rate and Rhythm: Normal rate and regular rhythm.     Heart sounds: No murmur heard. Pulmonary:     Effort: No respiratory distress.     Breath sounds: Normal breath sounds.  Musculoskeletal:  Cervical back: Normal range of motion and neck supple.  Neurological:     Mental Status: He is alert and oriented to person, place, and time.    Results for orders placed or performed in visit on 02/04/24  Lipid panel   Collection Time: 02/05/24  8:50 AM  Result Value Ref Range   Cholesterol, Total 184 100 - 199 mg/dL   Triglycerides 80 0 - 149 mg/dL   HDL 47 >40 mg/dL   VLDL Cholesterol Cal 15 5 - 40 mg/dL   LDL Chol Calc (NIH) 981 (H) 0 - 99 mg/dL   Chol/HDL Ratio 3.9 0.0 - 5.0 ratio  CMP14+EGFR   Collection Time: 02/05/24  8:50 AM  Result Value Ref Range   Glucose 101 (H) 70 - 99 mg/dL   BUN 18 6 - 24 mg/dL   Creatinine, Ser 1.91 0.76 - 1.27 mg/dL   eGFR 85 >47 WG/NFA/2.13   BUN/Creatinine Ratio 17 9 - 20   Sodium 135 134 - 144 mmol/L   Potassium 4.3 3.5 - 5.2 mmol/L    Chloride 98 96 - 106 mmol/L   CO2 24 20 - 29 mmol/L   Calcium 9.5 8.7 - 10.2 mg/dL   Total Protein 7.3 6.0 - 8.5 g/dL   Albumin 4.6 4.1 - 5.1 g/dL   Globulin, Total 2.7 1.5 - 4.5 g/dL   Bilirubin Total 0.5 0.0 - 1.2 mg/dL   Alkaline Phosphatase 50 44 - 121 IU/L   AST 26 0 - 40 IU/L   ALT 29 0 - 44 IU/L  CBC with Differential/Platelet   Collection Time: 02/05/24  8:50 AM  Result Value Ref Range   WBC 6.0 3.4 - 10.8 x10E3/uL   RBC 5.33 4.14 - 5.80 x10E6/uL   Hemoglobin 16.3 13.0 - 17.7 g/dL   Hematocrit 08.6 57.8 - 51.0 %   MCV 90 79 - 97 fL   MCH 30.6 26.6 - 33.0 pg   MCHC 33.9 31.5 - 35.7 g/dL   RDW 46.9 62.9 - 52.8 %   Platelets 262 150 - 450 x10E3/uL   Neutrophils 50 Not Estab. %   Lymphs 38 Not Estab. %   Monocytes 9 Not Estab. %   Eos 2 Not Estab. %   Basos 1 Not Estab. %   Neutrophils Absolute 2.9 1.4 - 7.0 x10E3/uL   Lymphocytes Absolute 2.3 0.7 - 3.1 x10E3/uL   Monocytes Absolute 0.6 0.1 - 0.9 x10E3/uL   EOS (ABSOLUTE) 0.1 0.0 - 0.4 x10E3/uL   Basophils Absolute 0.0 0.0 - 0.2 x10E3/uL   Immature Granulocytes 0 Not Estab. %   Immature Grans (Abs) 0.0 0.0 - 0.1 x10E3/uL  PSA, total and free   Collection Time: 02/05/24  8:50 AM  Result Value Ref Range   Prostate Specific Ag, Serum 0.4 0.0 - 4.0 ng/mL   PSA, Free 0.28 N/A ng/mL   PSA, Free Pct 70.0 %  Testosterone,Free and Total   Collection Time: 02/05/24  8:50 AM  Result Value Ref Range   Testosterone 336 264 - 916 ng/dL   Testosterone, Free WILL FOLLOW       Assessment & Plan:   Moyses Pavey" was seen today for medical management of chronic issues.  Diagnoses and all orders for this visit:  Dyslipidemia -     Lipid panel -     CMP14+EGFR -     CBC with Differential/Platelet  Hypogonadism male -     Lipid panel -     CMP14+EGFR -  CBC with Differential/Platelet -     testosterone cypionate (DEPOTESTOSTERONE CYPIONATE) 200 MG/ML injection; INJECT 0.5ML INTO THE      MUSCLE EVERY 10 DAYS.       DISCARD THE REMAINDER -     Testosterone,Free and Total  Vitamin D deficiency -     Lipid panel -     CMP14+EGFR -     CBC with Differential/Platelet -     Vitamin D, Ergocalciferol, (DRISDOL) 1.25 MG (50000 UNIT) CAPS capsule; TAKE 1 CAPSULE BY MOUTH EVERY 7 DAYS  Screening for prostate cancer -     PSA, total and free  Immunization due -     Hepatitis A hepatitis B combined vaccine IM       I have discontinued Amado Nash "Mickey"'s pantoprazole and diclofenac. I am also having him maintain his B-D 3CC LUER-LOK SYR 25GX1", testosterone cypionate, and Vitamin D (Ergocalciferol).  Allergies as of 02/04/2024   No Known Allergies      Medication List        Accurate as of February 04, 2024 11:59 PM. If you have any questions, ask your nurse or doctor.          STOP taking these medications    diclofenac 75 MG EC tablet Commonly known as: VOLTAREN Stopped by: Samanthajo Payano   pantoprazole 40 MG tablet Commonly known as: PROTONIX Stopped by: Herta Hink       TAKE these medications    B-D 3CC LUER-LOK SYR 25GX1" 25G X 1" 3 ML Misc Generic drug: SYRINGE-NEEDLE (DISP) 3 ML USE TO INJECT TESTOSTERONE EVERY 10 DAYS   testosterone cypionate 200 MG/ML injection Commonly known as: DEPOTESTOSTERONE CYPIONATE INJECT 0.5ML INTO THE      MUSCLE EVERY 10 DAYS.      DISCARD THE REMAINDER   Vitamin D (Ergocalciferol) 1.25 MG (50000 UNIT) Caps capsule Commonly known as: DRISDOL TAKE 1 CAPSULE BY MOUTH EVERY 7 DAYS What changed:  how much to take how to take this when to take this additional instructions Changed by: Broadus John Dawnyel Leven         Follow-up: No follow-ups on file.  Mechele Claude, M.D.

## 2024-02-05 ENCOUNTER — Other Ambulatory Visit: Payer: BC Managed Care – PPO

## 2024-02-05 DIAGNOSIS — E559 Vitamin D deficiency, unspecified: Secondary | ICD-10-CM | POA: Diagnosis not present

## 2024-02-05 DIAGNOSIS — Z125 Encounter for screening for malignant neoplasm of prostate: Secondary | ICD-10-CM | POA: Diagnosis not present

## 2024-02-05 DIAGNOSIS — E291 Testicular hypofunction: Secondary | ICD-10-CM | POA: Diagnosis not present

## 2024-02-05 DIAGNOSIS — E785 Hyperlipidemia, unspecified: Secondary | ICD-10-CM | POA: Diagnosis not present

## 2024-02-05 LAB — LIPID PANEL

## 2024-02-06 ENCOUNTER — Encounter: Payer: Self-pay | Admitting: Family Medicine

## 2024-02-08 ENCOUNTER — Encounter: Payer: Self-pay | Admitting: Family Medicine

## 2024-02-08 DIAGNOSIS — M7541 Impingement syndrome of right shoulder: Secondary | ICD-10-CM | POA: Diagnosis not present

## 2024-02-10 LAB — CMP14+EGFR
ALT: 29 IU/L (ref 0–44)
AST: 26 IU/L (ref 0–40)
Albumin: 4.6 g/dL (ref 4.1–5.1)
Alkaline Phosphatase: 50 IU/L (ref 44–121)
BUN/Creatinine Ratio: 17 (ref 9–20)
BUN: 18 mg/dL (ref 6–24)
Bilirubin Total: 0.5 mg/dL (ref 0.0–1.2)
CO2: 24 mmol/L (ref 20–29)
Calcium: 9.5 mg/dL (ref 8.7–10.2)
Chloride: 98 mmol/L (ref 96–106)
Creatinine, Ser: 1.08 mg/dL (ref 0.76–1.27)
Globulin, Total: 2.7 g/dL (ref 1.5–4.5)
Glucose: 101 mg/dL — ABNORMAL HIGH (ref 70–99)
Potassium: 4.3 mmol/L (ref 3.5–5.2)
Sodium: 135 mmol/L (ref 134–144)
Total Protein: 7.3 g/dL (ref 6.0–8.5)
eGFR: 85 mL/min/{1.73_m2} (ref >59)

## 2024-02-10 LAB — CBC WITH DIFFERENTIAL/PLATELET
Basophils Absolute: 0 10*3/uL (ref 0.0–0.2)
Basos: 1 %
EOS (ABSOLUTE): 0.1 10*3/uL (ref 0.0–0.4)
Eos: 2 %
Hematocrit: 48.1 % (ref 37.5–51.0)
Hemoglobin: 16.3 g/dL (ref 13.0–17.7)
Immature Grans (Abs): 0 10*3/uL (ref 0.0–0.1)
Immature Granulocytes: 0 %
Lymphocytes Absolute: 2.3 10*3/uL (ref 0.7–3.1)
Lymphs: 38 %
MCH: 30.6 pg (ref 26.6–33.0)
MCHC: 33.9 g/dL (ref 31.5–35.7)
MCV: 90 fL (ref 79–97)
Monocytes Absolute: 0.6 10*3/uL (ref 0.1–0.9)
Monocytes: 9 %
Neutrophils Absolute: 2.9 10*3/uL (ref 1.4–7.0)
Neutrophils: 50 %
Platelets: 262 10*3/uL (ref 150–450)
RBC: 5.33 x10E6/uL (ref 4.14–5.80)
RDW: 14 % (ref 11.6–15.4)
WBC: 6 10*3/uL (ref 3.4–10.8)

## 2024-02-10 LAB — PSA, TOTAL AND FREE
PSA, Free Pct: 70 %
PSA, Free: 0.28 ng/mL
Prostate Specific Ag, Serum: 0.4 ng/mL (ref 0.0–4.0)

## 2024-02-10 LAB — TESTOSTERONE,FREE AND TOTAL
Testosterone, Free: 6.6 pg/mL — ABNORMAL LOW (ref 6.8–21.5)
Testosterone: 336 ng/dL (ref 264–916)

## 2024-02-10 LAB — LIPID PANEL
Chol/HDL Ratio: 3.9 ratio (ref 0.0–5.0)
Cholesterol, Total: 184 mg/dL (ref 100–199)
HDL: 47 mg/dL (ref >39)
LDL Chol Calc (NIH): 122 mg/dL — ABNORMAL HIGH (ref 0–99)
Triglycerides: 80 mg/dL (ref 0–149)
VLDL Cholesterol Cal: 15 mg/dL (ref 5–40)

## 2024-02-15 DIAGNOSIS — M7541 Impingement syndrome of right shoulder: Secondary | ICD-10-CM | POA: Diagnosis not present

## 2024-02-19 DIAGNOSIS — M7541 Impingement syndrome of right shoulder: Secondary | ICD-10-CM | POA: Diagnosis not present

## 2024-02-29 DIAGNOSIS — M7541 Impingement syndrome of right shoulder: Secondary | ICD-10-CM | POA: Diagnosis not present

## 2024-03-18 DIAGNOSIS — M7541 Impingement syndrome of right shoulder: Secondary | ICD-10-CM | POA: Diagnosis not present

## 2024-04-03 ENCOUNTER — Other Ambulatory Visit: Payer: Self-pay | Admitting: Family Medicine

## 2024-04-03 DIAGNOSIS — E559 Vitamin D deficiency, unspecified: Secondary | ICD-10-CM

## 2024-04-23 ENCOUNTER — Other Ambulatory Visit: Payer: Self-pay | Admitting: Family Medicine

## 2024-04-23 DIAGNOSIS — E291 Testicular hypofunction: Secondary | ICD-10-CM

## 2024-06-27 ENCOUNTER — Ambulatory Visit: Payer: BC Managed Care – PPO | Admitting: Family Medicine

## 2024-06-27 VITALS — BP 142/89 | HR 58 | Temp 98.0°F | Ht 73.0 in | Wt 220.0 lb

## 2024-06-27 DIAGNOSIS — Z1211 Encounter for screening for malignant neoplasm of colon: Secondary | ICD-10-CM

## 2024-06-27 DIAGNOSIS — E291 Testicular hypofunction: Secondary | ICD-10-CM | POA: Diagnosis not present

## 2024-06-27 DIAGNOSIS — Z0000 Encounter for general adult medical examination without abnormal findings: Secondary | ICD-10-CM | POA: Diagnosis not present

## 2024-06-27 DIAGNOSIS — E559 Vitamin D deficiency, unspecified: Secondary | ICD-10-CM | POA: Insufficient documentation

## 2024-06-27 DIAGNOSIS — E785 Hyperlipidemia, unspecified: Secondary | ICD-10-CM | POA: Insufficient documentation

## 2024-06-27 LAB — URINALYSIS, ROUTINE W REFLEX MICROSCOPIC
Bilirubin, UA: NEGATIVE
Glucose, UA: NEGATIVE
Ketones, UA: NEGATIVE
Leukocytes,UA: NEGATIVE
Nitrite, UA: NEGATIVE
Protein,UA: NEGATIVE
RBC, UA: NEGATIVE
Specific Gravity, UA: 1.01 (ref 1.005–1.030)
Urobilinogen, Ur: 0.2 mg/dL (ref 0.2–1.0)
pH, UA: 7 (ref 5.0–7.5)

## 2024-06-27 MED ORDER — TESTOSTERONE CYPIONATE 200 MG/ML IM SOLN: INTRAMUSCULAR | 0 refills | Status: DC

## 2024-06-27 NOTE — Progress Notes (Signed)
 Subjective:  Patient ID: Jacob Morris, male    DOB: Jul 30, 1976  Age: 48 y.o. MRN: [id number]  CC: Medical Management of Chronic Issues (No concerns at this time. )   HPI Jacob Morris presents for Follow up for testosterone  deficiency: Pt. Using medication as directed. Denies any sx referrable to DVT such as edema or erythema of legs. No dyspnea or chest pain. Energy level reported as being good. Libido is normal and denies E.D. Feels strength is adequate and improved from baseline.  Patient in for follow-up of elevated cholesterol. Doing well without complaints on current medication. Denies side effects of statin including myalgia and arthralgia and nausea. Also in today for liver function testing. Currently no chest pain, shortness of breath or other cardiovascular related symptoms noted.  Patient's states he is due for follow-up on his colonoscopy.      06/27/2024    8:14 AM 11/16/2023   11:29 AM 11/16/2023   11:15 AM  Depression screen PHQ 2/9  Decreased Interest 0 1 0  Down, Depressed, Hopeless 0 1 0  PHQ - 2 Score 0 2 0  Altered sleeping 0 1   Tired, decreased energy 0 1   Change in appetite 0 0   Feeling bad or failure about yourself  0 1   Trouble concentrating 0 0   Moving slowly or fidgety/restless 0 0   Suicidal thoughts 0 0   PHQ-9 Score 0 5   Difficult doing work/chores Not difficult at all Not difficult at all     History Jacob Morris has a past medical history of Hyperlipidemia and Low testosterone .   He has a past surgical history that includes Vasectomy.   His family history includes Asthma in his mother; Heart attack in his maternal grandmother; Heart disease in his maternal grandfather and maternal grandmother; Hypertension in his mother; Other in his father.He reports that he has never smoked. He has never used smokeless tobacco. He reports current alcohol use. He reports that he does not use drugs.    ROS Review of Systems  Constitutional:  Negative.   HENT: Negative.    Eyes:  Negative for visual disturbance.  Respiratory:  Negative for cough and shortness of breath.   Cardiovascular:  Negative for chest pain and leg swelling.  Gastrointestinal:  Negative for abdominal pain, diarrhea, nausea and vomiting.  Genitourinary:  Negative for difficulty urinating.  Musculoskeletal:  Negative for arthralgias and myalgias.  Skin:  Negative for rash.  Neurological:  Negative for headaches.  Psychiatric/Behavioral:  Negative for sleep disturbance.     Objective:  BP (!) 142/89   Pulse (!) 58   Temp 98 F (36.7 C)   Ht 6' 1 (1.854 m)   Wt 220 lb (99.8 kg)   SpO2 95%   BMI 29.03 kg/m   BP Readings from Last 3 Encounters:  06/27/24 (!) 142/89  02/04/24 (!) 141/87  11/16/23 (!) 160/95    Wt Readings from Last 3 Encounters:  06/27/24 220 lb (99.8 kg)  02/04/24 220 lb (99.8 kg)  11/16/23 223 lb (101.2 kg)     Physical Exam Vitals reviewed.  Constitutional:      Appearance: He is well-developed.  HENT:     Head: Normocephalic and atraumatic.     Right Ear: External ear normal.     Left Ear: External ear normal.     Mouth/Throat:     Pharynx: No oropharyngeal exudate or posterior oropharyngeal erythema.  Eyes:     Pupils: Pupils  are equal, round, and reactive to light.  Cardiovascular:     Rate and Rhythm: Normal rate and regular rhythm.     Heart sounds: No murmur heard. Pulmonary:     Effort: No respiratory distress.     Breath sounds: Normal breath sounds.  Musculoskeletal:     Cervical back: Normal range of motion and neck supple.  Neurological:     Mental Status: He is alert and oriented to person, place, and time.      Assessment & Plan:  Hypogonadism male -     Testosterone  Cypionate; INJECT 0.5ML INTO THE      MUSCLE EVERY 10 DAYS.      DISCARD THE REMAINDER  Dispense: 10 mL; Refill: 0 -     Testosterone ,Free and Total -     CBC with Differential/Platelet -     CMP14+EGFR -     Urinalysis,  Routine w reflex microscopic  Dyslipidemia -     Lipid panel -     CBC with Differential/Platelet -     CMP14+EGFR  Vitamin D  deficiency -     VITAMIN D  25 Hydroxy (Vit-D Deficiency, Fractures)  Screen for colon cancer -     Ambulatory referral to Gastroenterology     Follow-up: Return in about 6 months (around 12/28/2024) for [facility] physical.  Butler Der, M.D.

## 2024-06-28 ENCOUNTER — Ambulatory Visit: Payer: Self-pay | Admitting: Family Medicine

## 2024-06-28 LAB — CBC WITH DIFFERENTIAL/PLATELET
Basophils Absolute: 0.1 x10E3/uL (ref 0.0–0.2)
Basos: 1 %
EOS (ABSOLUTE): 0.2 x10E3/uL (ref 0.0–0.4)
Eos: 3 %
Hematocrit: 48.2 % (ref 37.5–51.0)
Hemoglobin: 15.7 g/dL (ref 13.0–17.7)
Immature Grans (Abs): 0 x10E3/uL (ref 0.0–0.1)
Immature Granulocytes: 0 %
Lymphocytes Absolute: 2.1 x10E3/uL (ref 0.7–3.1)
Lymphs: 35 %
MCH: 29.7 pg (ref 26.6–33.0)
MCHC: 32.6 g/dL (ref 31.5–35.7)
MCV: 91 fL (ref 79–97)
Monocytes Absolute: 0.5 x10E3/uL (ref 0.1–0.9)
Monocytes: 9 %
Neutrophils Absolute: 3.1 x10E3/uL (ref 1.4–7.0)
Neutrophils: 52 %
Platelets: 247 x10E3/uL (ref 150–450)
RBC: 5.28 x10E6/uL (ref 4.14–5.80)
RDW: 13.7 % (ref 11.6–15.4)
WBC: 6 x10E3/uL (ref 3.4–10.8)

## 2024-06-28 LAB — CMP14+EGFR
ALT: 22 IU/L (ref 0–44)
AST: 28 IU/L (ref 0–40)
Albumin: 4.6 g/dL (ref 4.1–5.1)
Alkaline Phosphatase: 45 IU/L (ref 44–121)
BUN/Creatinine Ratio: 10 (ref 9–20)
BUN: 12 mg/dL (ref 6–24)
Bilirubin Total: 0.6 mg/dL (ref 0.0–1.2)
CO2: 24 mmol/L (ref 20–29)
Calcium: 9.6 mg/dL (ref 8.7–10.2)
Chloride: 100 mmol/L (ref 96–106)
Creatinine, Ser: 1.16 mg/dL (ref 0.76–1.27)
Globulin, Total: 2.3 g/dL (ref 1.5–4.5)
Glucose: 95 mg/dL (ref 70–99)
Potassium: 4.5 mmol/L (ref 3.5–5.2)
Sodium: 138 mmol/L (ref 134–144)
Total Protein: 6.9 g/dL (ref 6.0–8.5)
eGFR: 77 mL/min/1.73 (ref >59)

## 2024-06-28 LAB — VITAMIN D 25 HYDROXY (VIT D DEFICIENCY, FRACTURES): Vit D, 25-Hydroxy: 37.7 ng/mL (ref 30.0–100.0)

## 2024-06-28 LAB — LIPID PANEL
Chol/HDL Ratio: 3.9 ratio (ref 0.0–5.0)
Cholesterol, Total: 188 mg/dL (ref 100–199)
HDL: 48 mg/dL (ref >39)
LDL Chol Calc (NIH): 115 mg/dL — ABNORMAL HIGH (ref 0–99)
Triglycerides: 139 mg/dL (ref 0–149)
VLDL Cholesterol Cal: 25 mg/dL (ref 5–40)

## 2024-06-28 LAB — TESTOSTERONE,FREE AND TOTAL
Testosterone, Free: 14 pg/mL (ref 6.8–21.5)
Testosterone: 604 ng/dL (ref 264–916)

## 2024-07-31 ENCOUNTER — Other Ambulatory Visit: Payer: Self-pay | Admitting: Family Medicine

## 2024-07-31 DIAGNOSIS — E291 Testicular hypofunction: Secondary | ICD-10-CM

## 2024-08-01 ENCOUNTER — Other Ambulatory Visit: Payer: Self-pay | Admitting: Family Medicine

## 2024-08-23 ENCOUNTER — Encounter: Payer: Self-pay | Admitting: Family Medicine

## 2024-08-29 ENCOUNTER — Other Ambulatory Visit: Payer: Self-pay | Admitting: Family Medicine

## 2024-08-29 DIAGNOSIS — E559 Vitamin D deficiency, unspecified: Secondary | ICD-10-CM

## 2024-11-09 DIAGNOSIS — L573 Poikiloderma of Civatte: Secondary | ICD-10-CM | POA: Diagnosis not present

## 2024-11-09 DIAGNOSIS — L570 Actinic keratosis: Secondary | ICD-10-CM | POA: Diagnosis not present

## 2025-01-02 ENCOUNTER — Ambulatory Visit: Payer: Self-pay | Admitting: Family Medicine

## 2025-01-05 ENCOUNTER — Ambulatory Visit: Payer: Self-pay | Admitting: Family Medicine

## 2025-01-05 ENCOUNTER — Encounter: Payer: Self-pay | Admitting: Family Medicine

## 2025-01-05 VITALS — BP 148/82 | HR 59 | Temp 98.0°F | Ht 73.0 in | Wt 222.0 lb

## 2025-01-05 DIAGNOSIS — E291 Testicular hypofunction: Secondary | ICD-10-CM

## 2025-01-05 DIAGNOSIS — Z1211 Encounter for screening for malignant neoplasm of colon: Secondary | ICD-10-CM | POA: Diagnosis not present

## 2025-01-05 DIAGNOSIS — I10 Essential (primary) hypertension: Secondary | ICD-10-CM | POA: Diagnosis not present

## 2025-01-05 DIAGNOSIS — E785 Hyperlipidemia, unspecified: Secondary | ICD-10-CM

## 2025-01-05 LAB — LIPID PANEL

## 2025-01-05 MED ORDER — TESTOSTERONE CYPIONATE 200 MG/ML IM SOLN: INTRAMUSCULAR | 1 refills | Status: AC

## 2025-01-05 MED ORDER — OLMESARTAN MEDOXOMIL 20 MG PO TABS: 20.0000 mg | ORAL_TABLET | Freq: Every day | ORAL | 2 refills | Status: AC

## 2025-01-05 NOTE — Progress Notes (Signed)
 "  Subjective:  Patient ID: Jacob Morris, male    DOB: 1976/04/25  Age: 49 y.o. MRN: [id number]  CC: Medical Management of Chronic Issues (Pt had a exam in October. Chart brought with him. )   HPI  Discussed the use of AI scribe software for clinical note transcription with the patient, who gave verbal consent to proceed.  History of Present Illness   in for follow-up of elevated cholesterol. Doing well without complaints on current medication. Denies side effects of statin including myalgia and arthralgia and nausea. Currently no chest pain, shortness of breath or other cardiovascular related symptoms noted.      Jacob Morris is a 49 year old male who presents for follow-up after an executive physical and evaluation for hypertension.  He underwent an executive physical at the [facility], where his cardiac calcium score was zero, but his blood pressure was elevated.  He was started on rosuvastatin three months ago by the evaluating physician at the [facility] and is here for a recheck of his levels.  He is also being followed for hypogonadism, with a recent testosterone  level drawn during the evaluation, which was within the normal range.  He reports doing well, engaging in regular exercise, and maintaining fitness.  There is a family history of cardiovascular disease.          06/27/2024    8:14 AM 11/16/2023   11:29 AM 11/16/2023   11:15 AM  Depression screen PHQ 2/9  Decreased Interest 0 1 0  Down, Depressed, Hopeless 0 1 0  PHQ - 2 Score 0 2 0  Altered sleeping 0 1   Tired, decreased energy 0 1   Change in appetite 0 0   Feeling bad or failure about yourself  0 1   Trouble concentrating 0 0   Moving slowly or fidgety/restless 0 0   Suicidal thoughts 0 0   PHQ-9 Score 0  5    Difficult doing work/chores Not difficult at all Not difficult at all      Data saved with a previous flowsheet row definition    History Jacob Morris has a past  medical history of Hyperlipidemia and Low testosterone .   He has a past surgical history that includes Vasectomy.   His family history includes Asthma in his mother; Heart attack in his maternal grandmother; Heart disease in his maternal grandfather and maternal grandmother; Hypertension in his mother; Other in his father.He reports that he has never smoked. He has never used smokeless tobacco. He reports current alcohol use. He reports that he does not use drugs.    ROS Review of Systems  Constitutional: Negative.   HENT: Negative.    Eyes:  Negative for visual disturbance.  Respiratory:  Negative for cough and shortness of breath.   Cardiovascular:  Negative for chest pain and leg swelling.  Gastrointestinal:  Negative for abdominal pain, diarrhea, nausea and vomiting.  Genitourinary:  Negative for difficulty urinating.  Musculoskeletal:  Negative for arthralgias and myalgias.  Skin:  Negative for rash.  Neurological:  Negative for headaches.  Psychiatric/Behavioral:  Negative for sleep disturbance.     Objective:  BP (!) 148/82   Pulse (!) 59   Temp 98 F (36.7 C)   Ht 6' 1 (1.854 m)   Wt 222 lb (100.7 kg)   SpO2 97%   BMI 29.29 kg/m   BP Readings from Last 3 Encounters:  01/05/25 (!) 148/82  06/27/24 (!) 142/89  02/04/24 (!) 141/87  Wt Readings from Last 3 Encounters:  01/05/25 222 lb (100.7 kg)  06/27/24 220 lb (99.8 kg)  02/04/24 220 lb (99.8 kg)     Physical Exam Physical Exam GENERAL: Alert, cooperative, well developed, no acute distress HEENT: Normocephalic, normal oropharynx, moist mucous membranes CHEST: Clear to auscultation bilaterally, No wheezes, rhonchi, or crackles CARDIOVASCULAR: Normal heart rate and rhythm, S1 and S2 normal without murmurs ABDOMEN: Soft, non-tender, non-distended, without organomegaly, Normal bowel sounds EXTREMITIES: No cyanosis or edema NEUROLOGICAL: Cranial nerves grossly intact, Moves all extremities without gross  motor or sensory deficit   Assessment & Plan:  Primary hypertension -     CMP14+EGFR -     Lipid panel -     CBC with Differential/Platelet  Screen for colon cancer -     Cologuard  Hypogonadism male  Dyslipidemia -     CMP14+EGFR -     Lipid panel -     CBC with Differential/Platelet  Other orders -     Olmesartan  Medoxomil; Take 1 tablet (20 mg total) by mouth daily.  Dispense: 30 tablet; Refill: 2    Assessment and Plan Assessment & Plan Primary hypertension   Hypertension is present with elevated blood pressure and a family history of cardiovascular disease. Cardiac calcium score is zero, indicating no significant coronary artery calcification. Evaluated for hypertension management.  Dyslipidemia   Managed with rosuvastatin, initiated three months ago. Lipid levels rechecked. Denies myalgia, arthralgia  Hypogonadism, male   Hypogonadism managed with testosterone  cypionate. Recent testosterone  level is within normal range. Regular exercise and fitness maintained. Continue testosterone  cypionate 0.5 mL intramuscular every 10 days.       Follow-up: Return in about 6 weeks (around 02/16/2025) for cholesterol, hypertension.  Butler Der, M.D. "

## 2025-01-06 LAB — CMP14+EGFR
ALT: 29 [IU]/L (ref 0–44)
AST: 39 [IU]/L (ref 0–40)
Albumin: 4.6 g/dL (ref 4.1–5.1)
Alkaline Phosphatase: 42 [IU]/L — ABNORMAL LOW (ref 47–123)
BUN/Creatinine Ratio: 8 — ABNORMAL LOW (ref 9–20)
BUN: 9 mg/dL (ref 6–24)
Bilirubin Total: 0.7 mg/dL (ref 0.0–1.2)
CO2: 25 mmol/L (ref 20–29)
Calcium: 9.7 mg/dL (ref 8.7–10.2)
Chloride: 98 mmol/L (ref 96–106)
Creatinine, Ser: 1.18 mg/dL (ref 0.76–1.27)
Globulin, Total: 2.5 g/dL (ref 1.5–4.5)
Glucose: 90 mg/dL (ref 70–99)
Potassium: 4.5 mmol/L (ref 3.5–5.2)
Sodium: 137 mmol/L (ref 134–144)
Total Protein: 7.1 g/dL (ref 6.0–8.5)
eGFR: 76 mL/min/{1.73_m2}

## 2025-01-06 LAB — CBC WITH DIFFERENTIAL/PLATELET
Basophils Absolute: 0 10*3/uL (ref 0.0–0.2)
Basos: 1 %
EOS (ABSOLUTE): 0.2 10*3/uL (ref 0.0–0.4)
Eos: 3 %
Hematocrit: 49 % (ref 37.5–51.0)
Hemoglobin: 16.7 g/dL (ref 13.0–17.7)
Immature Grans (Abs): 0 10*3/uL (ref 0.0–0.1)
Immature Granulocytes: 0 %
Lymphocytes Absolute: 1.9 10*3/uL (ref 0.7–3.1)
Lymphs: 34 %
MCH: 30.1 pg (ref 26.6–33.0)
MCHC: 34.1 g/dL (ref 31.5–35.7)
MCV: 88 fL (ref 79–97)
Monocytes Absolute: 0.5 10*3/uL (ref 0.1–0.9)
Monocytes: 10 %
Neutrophils Absolute: 3 10*3/uL (ref 1.4–7.0)
Neutrophils: 52 %
Platelets: 226 10*3/uL (ref 150–450)
RBC: 5.55 x10E6/uL (ref 4.14–5.80)
RDW: 13.5 % (ref 11.6–15.4)
WBC: 5.6 10*3/uL (ref 3.4–10.8)

## 2025-01-06 LAB — LIPID PANEL
Chol/HDL Ratio: 3 ratio (ref 0.0–5.0)
Cholesterol, Total: 119 mg/dL (ref 100–199)
HDL: 40 mg/dL
LDL Chol Calc (NIH): 63 mg/dL (ref 0–99)
Triglycerides: 77 mg/dL (ref 0–149)
VLDL Cholesterol Cal: 16 mg/dL (ref 5–40)

## 2025-01-08 ENCOUNTER — Encounter: Payer: Self-pay | Admitting: Family Medicine

## 2025-01-09 ENCOUNTER — Ambulatory Visit: Payer: Self-pay | Admitting: Family Medicine

## 2025-01-09 NOTE — Progress Notes (Signed)
Hello Amadou,  Your lab result is normal and/or stable.Some minor variations that are not significant are commonly marked abnormal, but do not represent any medical problem for you.  Best regards, Virna Livengood, M.D.

## 2025-02-16 ENCOUNTER — Ambulatory Visit: Admitting: Family Medicine
